# Patient Record
Sex: Female | Born: 1965 | Race: White | Hispanic: No | Marital: Married | Smoking: Never smoker
Health system: Southern US, Community
[De-identification: ages and names within clinical notes are randomized; demographics above are authoritative.]

## PROBLEM LIST (undated history)

## (undated) DIAGNOSIS — R112 Nausea with vomiting, unspecified: Secondary | ICD-10-CM

## (undated) HISTORY — PX: COLON SURGERY: SHX602

## (undated) HISTORY — PX: APPENDECTOMY: SHX54

---

## 1998-03-11 ENCOUNTER — Other Ambulatory Visit: Admission: RE | Admit: 1998-03-11 | Discharge: 1998-03-11 | Payer: Self-pay | Admitting: *Deleted

## 1998-05-09 ENCOUNTER — Other Ambulatory Visit: Admission: RE | Admit: 1998-05-09 | Discharge: 1998-05-09 | Payer: Self-pay | Admitting: *Deleted

## 1998-11-14 ENCOUNTER — Other Ambulatory Visit: Admission: RE | Admit: 1998-11-14 | Discharge: 1998-11-14 | Payer: Self-pay | Admitting: *Deleted

## 2000-05-17 ENCOUNTER — Other Ambulatory Visit: Admission: RE | Admit: 2000-05-17 | Discharge: 2000-05-17 | Payer: Self-pay | Admitting: Obstetrics and Gynecology

## 2000-06-21 ENCOUNTER — Encounter: Payer: Self-pay | Admitting: Obstetrics and Gynecology

## 2000-06-21 ENCOUNTER — Ambulatory Visit (HOSPITAL_COMMUNITY): Admission: RE | Admit: 2000-06-21 | Discharge: 2000-06-21 | Payer: Self-pay | Admitting: Obstetrics and Gynecology

## 2000-08-22 ENCOUNTER — Inpatient Hospital Stay (HOSPITAL_COMMUNITY): Admission: AD | Admit: 2000-08-22 | Discharge: 2000-08-22 | Payer: Self-pay | Admitting: Obstetrics and Gynecology

## 2000-11-15 ENCOUNTER — Inpatient Hospital Stay (HOSPITAL_COMMUNITY): Admission: AD | Admit: 2000-11-15 | Discharge: 2000-11-15 | Payer: Self-pay | Admitting: Obstetrics and Gynecology

## 2000-11-18 ENCOUNTER — Inpatient Hospital Stay (HOSPITAL_COMMUNITY): Admission: AD | Admit: 2000-11-18 | Discharge: 2000-11-18 | Payer: Self-pay | Admitting: Obstetrics and Gynecology

## 2000-11-21 ENCOUNTER — Inpatient Hospital Stay (HOSPITAL_COMMUNITY): Admission: AD | Admit: 2000-11-21 | Discharge: 2000-11-23 | Payer: Self-pay | Admitting: Obstetrics and Gynecology

## 2000-11-21 ENCOUNTER — Encounter (INDEPENDENT_AMBULATORY_CARE_PROVIDER_SITE_OTHER): Payer: Self-pay

## 2002-07-05 ENCOUNTER — Other Ambulatory Visit: Admission: RE | Admit: 2002-07-05 | Discharge: 2002-07-05 | Payer: Self-pay | Admitting: Obstetrics and Gynecology

## 2003-07-08 ENCOUNTER — Other Ambulatory Visit: Admission: RE | Admit: 2003-07-08 | Discharge: 2003-07-08 | Payer: Self-pay | Admitting: Obstetrics and Gynecology

## 2004-07-03 ENCOUNTER — Other Ambulatory Visit: Admission: RE | Admit: 2004-07-03 | Discharge: 2004-07-03 | Payer: Self-pay | Admitting: Obstetrics and Gynecology

## 2009-08-22 ENCOUNTER — Encounter: Admission: RE | Admit: 2009-08-22 | Discharge: 2009-08-22 | Payer: Self-pay | Admitting: Obstetrics and Gynecology

## 2010-05-03 ENCOUNTER — Encounter: Payer: Self-pay | Admitting: Obstetrics and Gynecology

## 2010-08-28 NOTE — Op Note (Signed)
[facility]  Patient:    Debra Walker, Debra Walker                     MRN: [id number] Proc. Date: 11/22/00 Adm. Date:  [id number] Attending:  Oliver Pila                           Operative Report  PREOPERATIVE DIAGNOSIS:       Desires surgical sterility.  POSTOPERATIVE DIAGNOSIS:      Desires surgical sterility.  PROCEDURE:                    Bilateral partial salpingectomy.  SURGEON:                      Zenaida Niece, M.D.  ANESTHESIA:                   Epidural.  ESTIMATED BLOOD LOSS:         Less than 50 cc.  FINDINGS:                     Normal anatomy.  PROCEDURE IN DETAIL:          The patient was taken to the operating room and placed in the dorsal supine position, where her previously-placed epidural was dosed appropriately.  Her abdomen was then prepped and draped in the usual sterile fashion.  The level of her anesthesia was found to be adequate and her infraumbilical skin was infiltrated with 0.25% Marcaine.  A 3 cm horizontal incision was made and carried down to the fascia.  The fascia was elevated and incised and the peritoneum entered bluntly.  Both fallopian tubes were identified and traced to their fimbriated ends.  A knuckle of tube on each side was ligated with 0 plain gut suture.  The knuckle of tube was removed sharply.  Both ostia were identified and both stumps were hemostatic.  The fascia was then closed with running 0 Vicryl and the skin closed with running subcuticular 4-0 Vicryl.  A Band-aid was then placed.  The patient tolerated the procedure well and was taken to the recovery room in stable condition. Counts were correct x 2. DD:  11/22/00 TD:  11/22/00 Job: [id number]

## 2010-08-28 NOTE — Discharge Summary (Signed)
[facility]  Patient:    Debra Walker, Debra Walker Visit Number: [id number] MRN: [id number]          Service Type: OBS Location: 910A [id number] Attending Physician:  Oliver Pila Dictated by:   Alvino Chapel, M.D. Adm. Date:  11/21/2000 Disc. Date: 11/23/2000                             Discharge Summary  DISCHARGE DIAGNOSES:          1. Term pregnancy at 40+ weeks, delivered.                               2. Pregnancy induced hypertension.                               3. Previous cesarean section.                               4. Advanced maternal age.                               5. Desires surgical sterility.                               6. Status post normal spontaneous vaginal                                  delivery/vaginal birth after cesarean.                               7. Status post postpartum tubal ligation.  DISCHARGE MEDICATIONS:        1. Percocet one to two tablets p.o. every                                  four hours p.r.n.                               2. Motrin 600 mg p.o. every six hours p.r.n.  HISTORY OF PRESENT ILLNESS:   The patient is a 45 year old, G5, P2-0-2-2, who was admitted at 40+ weeks for induction given the increased blood pressure but no other evidence of preeclampsia. Blood pressures in the office were 160s/90s-100, on bed rest were 130s/70s. Pregnancy was complicated by advanced maternal age, patient declined amniocentesis; also, a positive group B strep status, and prior low transverse cesarean section and previous VBAC. Patient desired a BTL.  PRENATAL LABORATORY DATA:     O negative, antibody negative, RPR nonreactive, rubella immune, hepatitis B surface antigen negative, HIV negative, GC negative, Chlamydia negative, GBS positive.  PAST OBSTETRICAL HISTORY:     In 1983 she had a spontaneous miscarriage. In 1984, another spontaneous miscarriage. In 1988 she had a low transverse cesarean  section of a 6-pound 13-ounce infant for fetal distress. In 1993 she had a vaginal birth after cesarean of a 7-pound infant.  PAST GYNECOLOGICAL HISTORY:  History of cryosurgery at 45 years old with normal Pap smears since that time.  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  PAST MEDICAL HISTORY:         History of alcohol abuse.  ALLERGIES:                    CODEINE.  MEDICATIONS:                  Without medications.  HOSPITAL COURSE:              On admission she was afebrile with blood pressure 130/70 at rest. Cervix was 50% effaced, 2 cm, and -2 station. The patient had assisted rupture of membranes with clear fluid obtained and was begun on group B strep prophylaxis with penicillin. She progressed well throughout the day and was noted to have some thick meconium later in the evening, therefore, an IUPC was placed and an amnioinfusion begun. She reached complete dilation and pushed well with normal spontaneous vaginal delivery of a vigorous female infant over a second-degree vaginal laceration. Apgars were 8 and 9. Weight was 7 pounds 15 ounces. The placenta delivered spontaneously. The second-degree laceration was repaired with 2-0 Vicryl. Cervix and rectum were intact.  The patient then underwent a postpartum tubal ligation on postpartum day #1 by Dr. Jackelyn Knife without complication.  On postpartum day #2 she was afebrile. Her incision at her umbilicus was clear; therefore, she was felt stable for discharge and was discharged to home with followup in six weeks. Dictated by:   Alvino Chapel, M.D. Attending Physician:  Oliver Pila DD:  12/06/00 TD:  12/06/00 Job: [id number]

## 2011-10-18 ENCOUNTER — Other Ambulatory Visit: Payer: Self-pay | Admitting: Obstetrics and Gynecology

## 2011-10-18 DIAGNOSIS — R928 Other abnormal and inconclusive findings on diagnostic imaging of breast: Secondary | ICD-10-CM

## 2011-10-26 ENCOUNTER — Ambulatory Visit
Admission: RE | Admit: 2011-10-26 | Discharge: 2011-10-26 | Disposition: A | Discharge status: Home / Self Care | Payer: Private Health Insurance - Indemnity | Source: Ambulatory Visit | Attending: Obstetrics and Gynecology | Admitting: Obstetrics and Gynecology

## 2011-10-26 DIAGNOSIS — R928 Other abnormal and inconclusive findings on diagnostic imaging of breast: Secondary | ICD-10-CM

## 2014-05-22 ENCOUNTER — Other Ambulatory Visit: Payer: Self-pay | Admitting: Physician Assistant

## 2014-05-22 DIAGNOSIS — R1011 Right upper quadrant pain: Secondary | ICD-10-CM

## 2014-05-22 DIAGNOSIS — D539 Nutritional anemia, unspecified: Secondary | ICD-10-CM

## 2014-05-22 DIAGNOSIS — R109 Unspecified abdominal pain: Secondary | ICD-10-CM

## 2014-05-30 ENCOUNTER — Ambulatory Visit
Admission: RE | Admit: 2014-05-30 | Discharge: 2014-05-30 | Disposition: A | Discharge status: Home / Self Care | Payer: Managed Care, Other (non HMO) | Source: Ambulatory Visit | Attending: Physician Assistant | Admitting: Physician Assistant

## 2014-05-30 DIAGNOSIS — R109 Unspecified abdominal pain: Secondary | ICD-10-CM

## 2014-05-30 DIAGNOSIS — D539 Nutritional anemia, unspecified: Secondary | ICD-10-CM

## 2014-05-30 DIAGNOSIS — R1011 Right upper quadrant pain: Secondary | ICD-10-CM

## 2015-09-28 ENCOUNTER — Encounter (HOSPITAL_COMMUNITY): Payer: Self-pay

## 2015-09-28 ENCOUNTER — Emergency Department (HOSPITAL_COMMUNITY)
Admission: EM | Admit: 2015-09-28 | Discharge: 2015-09-29 | Disposition: A | Discharge status: Home / Self Care | Payer: Managed Care, Other (non HMO) | Attending: Emergency Medicine | Admitting: Emergency Medicine

## 2015-09-28 DIAGNOSIS — R109 Unspecified abdominal pain: Secondary | ICD-10-CM | POA: Diagnosis present

## 2015-09-28 DIAGNOSIS — F17210 Nicotine dependence, cigarettes, uncomplicated: Secondary | ICD-10-CM | POA: Insufficient documentation

## 2015-09-28 LAB — COMPREHENSIVE METABOLIC PANEL
ALBUMIN: 3.6 g/dL (ref 3.5–5.0)
ALK PHOS: 49 U/L (ref 38–126)
ALT: 23 U/L (ref 14–54)
ANION GAP: 7 (ref 5–15)
AST: 27 U/L (ref 15–41)
BUN: 9 mg/dL (ref 6–20)
CALCIUM: 8.7 mg/dL — AB (ref 8.9–10.3)
CHLORIDE: 108 mmol/L (ref 101–111)
CO2: 24 mmol/L (ref 22–32)
Creatinine, Ser: 0.84 mg/dL (ref 0.44–1.00)
GFR calc Af Amer: >60 mL/min (ref >60)
GFR calc non Af Amer: >60 mL/min (ref >60)
GLUCOSE: 111 mg/dL — AB (ref 65–99)
Potassium: 3.7 mmol/L (ref 3.5–5.1)
SODIUM: 139 mmol/L (ref 135–145)
Total Bilirubin: 0.8 mg/dL (ref 0.3–1.2)
Total Protein: 6.6 g/dL (ref 6.5–8.1)

## 2015-09-28 LAB — URINALYSIS, ROUTINE W REFLEX MICROSCOPIC
BILIRUBIN URINE: NEGATIVE
GLUCOSE, UA: NEGATIVE mg/dL
HGB URINE DIPSTICK: NEGATIVE
Ketones, ur: 15 mg/dL — AB
Leukocytes, UA: NEGATIVE
Nitrite: NEGATIVE
PH: 6.5 (ref 5.0–8.0)
Protein, ur: NEGATIVE mg/dL
SPECIFIC GRAVITY, URINE: 1.027 (ref 1.005–1.030)

## 2015-09-28 LAB — CBC
HEMATOCRIT: 40.1 % (ref 36.0–46.0)
HEMOGLOBIN: 13.1 g/dL (ref 12.0–15.0)
MCH: 29.1 pg (ref 26.0–34.0)
MCHC: 32.7 g/dL (ref 30.0–36.0)
MCV: 89.1 fL (ref 78.0–100.0)
Platelets: 260 10*3/uL (ref 150–400)
RBC: 4.5 MIL/uL (ref 3.87–5.11)
RDW: 13.9 % (ref 11.5–15.5)
WBC: 8 10*3/uL (ref 4.0–10.5)

## 2015-09-28 LAB — LIPASE, BLOOD: Lipase: 27 U/L (ref 11–51)

## 2015-09-28 LAB — I-STAT BETA HCG BLOOD, ED (MC, WL, AP ONLY): I-stat hCG, quantitative: <5 m[IU]/mL (ref <5)

## 2015-09-28 MED ORDER — ONDANSETRON 4 MG PO TBDP
4.0000 mg | ORAL_TABLET | Freq: Once | ORAL | Status: AC | PRN
  Administered 2015-09-28: 4 mg via ORAL

## 2015-09-28 MED ORDER — ONDANSETRON 4 MG PO TBDP
ORAL_TABLET | ORAL | Status: AC
  Filled 2015-09-28: qty 1

## 2015-09-28 NOTE — ED Provider Notes (Signed)
CSN: [id number]     Arrival date & time 09/28/15  2029 History   First MD Initiated Contact with Patient 09/28/15 2305     Chief Complaint  Patient presents with  . Flank Pain     (Consider location/radiation/quality/duration/timing/severity/associated sxs/prior Treatment) HPI Comments: Patient without significant medical history presents with pain in the right flank that started this morning and remained constant throughout the day. She has had persistent nausea without vomiting and loose non-bloody stool x 3. No fever. The pain radiated into the RLQ abdomen at the worst times but no radiation currently. No urinary symptoms of hesitancy, hematuria, dysuria or frequency. No history of kidney stones. She reports same symptoms episodically over the last several years. She has a history of appendectomy with small portion of cecum removed at that time secondary to infection and became concerned pain was related to her previous bowel problem. She states she had a colonoscopy and EGD last year that was clear.  Patient is a 50 y.o. female presenting with flank pain. The history is provided by the patient. No language interpreter was used.  Flank Pain This is a recurrent problem. The current episode started today. The problem occurs constantly. The problem has been gradually improving. Associated symptoms include nausea and vomiting. Pertinent negatives include no abdominal pain, chest pain, chills, coughing or fever.    History reviewed. No pertinent past medical history. Past Surgical History  Procedure Laterality Date  . Appendectomy    . Colon surgery     History reviewed. No pertinent family history. Social History  Substance Use Topics  . Smoking status: Current Every Day Smoker    Types: E-cigarettes  . Smokeless tobacco: None  . Alcohol Use: Yes     Comment: rarely    OB History    No data available     Review of Systems  Constitutional: Negative for fever and chills.    Respiratory: Negative.  Negative for cough and shortness of breath.   Cardiovascular: Negative.  Negative for chest pain.  Gastrointestinal: Positive for nausea, vomiting and diarrhea. Negative for abdominal pain.  Genitourinary: Positive for flank pain. Negative for dysuria, frequency and hematuria.  Musculoskeletal: Negative.   Neurological: Negative.       Allergies  Codeine  Home Medications   Prior to Admission medications   Medication Sig Start Date End Date Taking? Authorizing Provider  cetirizine-pseudoephedrine (ZYRTEC-D) 5-120 MG tablet Take 1 tablet by mouth daily as needed for allergies.   Yes Historical Provider, MD  Prenatal Vit-Fe Fumarate-FA (MULTIVITAMIN-PRENATAL) 27-0.8 MG TABS tablet Take 1 tablet by mouth daily at 12 noon.   Yes Historical Provider, MD  TRIANEX 0.05 % OINT Take 1 application by mouth 2 (two) times daily as needed (for itching).  09/12/15  Yes Historical Provider, MD   BP 120/62 mmHg  Pulse 75  Temp(Src) 98.6 F (37 C) (Oral)  Resp 18  SpO2 97%  LMP 09/14/2015 Physical Exam  Constitutional: She is oriented to person, place, and time. She appears well-developed and well-nourished.  HENT:  Head: Normocephalic.  Neck: Normal range of motion. Neck supple.  Cardiovascular: Normal rate and regular rhythm.   Pulmonary/Chest: Effort normal and breath sounds normal.  Abdominal: Soft. Bowel sounds are normal. There is no tenderness. There is no rebound and no guarding.  Musculoskeletal: Normal range of motion.       Arms: Neurological: She is alert and oriented to person, place, and time.  Skin: Skin is warm and dry. No rash  noted.  Psychiatric: She has a normal mood and affect.    ED Course  Procedures (including critical care time) Labs Review Labs Reviewed  COMPREHENSIVE METABOLIC PANEL - Abnormal; Notable for the following:    Glucose, Bld 111 (*)    Calcium 8.7 (*)    All other components within normal limits  URINALYSIS, ROUTINE W  REFLEX MICROSCOPIC (NOT AT [facility]) - Abnormal; Notable for the following:    Ketones, ur 15 (*)    All other components within normal limits  LIPASE, BLOOD  CBC  I-STAT BETA HCG BLOOD, ED ([facility], [facility], [facility] ONLY)   Results for orders placed or performed during the hospital encounter of 09/28/15  Lipase, blood  Result Value Ref Range   Lipase 27 11 - 51 U/L  Comprehensive metabolic panel  Result Value Ref Range   Sodium 139 135 - 145 mmol/L   Potassium 3.7 3.5 - 5.1 mmol/L   Chloride 108 101 - 111 mmol/L   CO2 24 22 - 32 mmol/L   Glucose, Bld 111 (H) 65 - 99 mg/dL   BUN 9 6 - 20 mg/dL   Creatinine, Ser 1.61 0.44 - 1.00 mg/dL   Calcium 8.7 (L) 8.9 - 10.3 mg/dL   Total Protein 6.6 6.5 - 8.1 g/dL   Albumin 3.6 3.5 - 5.0 g/dL   AST 27 15 - 41 U/L   ALT 23 14 - 54 U/L   Alkaline Phosphatase 49 38 - 126 U/L   Total Bilirubin 0.8 0.3 - 1.2 mg/dL   GFR calc non Af Amer >60 >60 mL/min   GFR calc Af Amer >60 >60 mL/min   Anion gap 7 5 - 15  CBC  Result Value Ref Range   WBC 8.0 4.0 - 10.5 K/uL   RBC 4.50 3.87 - 5.11 MIL/uL   Hemoglobin 13.1 12.0 - 15.0 g/dL   HCT 09.6 04.5 - 40.9 %   MCV 89.1 78.0 - 100.0 fL   MCH 29.1 26.0 - 34.0 pg   MCHC 32.7 30.0 - 36.0 g/dL   RDW 81.1 91.4 - 78.2 %   Platelets 260 150 - 400 K/uL  Urinalysis, Routine w reflex microscopic  Result Value Ref Range   Color, Urine YELLOW YELLOW   APPearance CLEAR CLEAR   Specific Gravity, Urine 1.027 1.005 - 1.030   pH 6.5 5.0 - 8.0   Glucose, UA NEGATIVE NEGATIVE mg/dL   Hgb urine dipstick NEGATIVE NEGATIVE   Bilirubin Urine NEGATIVE NEGATIVE   Ketones, ur 15 (A) NEGATIVE mg/dL   Protein, ur NEGATIVE NEGATIVE mg/dL   Nitrite NEGATIVE NEGATIVE   Leukocytes, UA NEGATIVE NEGATIVE  I-Stat beta hCG blood, ED  Result Value Ref Range   I-stat hCG, quantitative <5.0 <5 mIU/mL   Comment 3           Ct Abdomen Pelvis Wo Contrast  09/29/2015  CLINICAL DATA:  50 year old female with right flank pain. EXAM: CT ABDOMEN AND  PELVIS WITHOUT CONTRAST TECHNIQUE: Multidetector CT imaging of the abdomen and pelvis was performed following the standard protocol without IV contrast. COMPARISON:  None. FINDINGS: Evaluation of this exam is limited in the absence of intravenous contrast. The visualized lung bases are clear. No intra-abdominal free air or free fluid. The liver, gallbladder, pancreas, spleen, adrenal glands appear unremarkable. Vascular calcification versus punctate nonobstructing left renal upper pole calculus. No hydronephrosis. The right kidney is unremarkable. The visualized ureters and urinary bladder appear unremarkable. The uterus and the ovaries are grossly unremarkable. There  is postsurgical changes of the bowel with ileocolic anastomosis in the right lower quadrant. There is no evidence of bowel obstruction or active inflammation. Appendectomy. There is mild to moderate aortoiliac atherosclerotic disease. Evaluation of the vasculature is limited in the absence of intravenous contrast. No portal venous gas identified. There is no adenopathy. The abdominal wall soft tissues appear unremarkable. The osseous structures are intact. IMPRESSION: No acute intra-abdominal pelvic pathology. No hydronephrosis or obstructing stone. Vascular calcification versus a nonobstructing punctate left renal upper pole calculus. Electronically Signed   By: Elgie Collard M.D.   On: 09/29/2015 01:39     Imaging Review No results found. I have personally reviewed and evaluated these images and lab results as part of my medical decision-making.   EKG Interpretation None      MDM   Final diagnoses:  None    1. Right flank pain  The patient presents with recurrent right flank pain that started earlier today. She states pain has been recurrent over a couple of years. No fever.   Labs and CT (w/o CM) are reassuring without concern for infection or acute process. She is resting comfortably, reporting pain increases when she is  active and better when she rests. No nausea currently. She is felt stable for discharge home with PCP follow up for recheck and further outpatient testing as appropriate.     Elpidio Anis, PA-C 09/29/15 [id number]  Geoffery Lyons, MD 09/29/15 [id number]

## 2015-09-28 NOTE — ED Notes (Addendum)
Onset this morning right flank pain, non radiating.  Pain causing nausea.  Diarrhea x 2, watery.  No fever.   Pt has had same pain numerous times in past, no diagnosis found, last episode 1-2 months ago.

## 2015-09-29 ENCOUNTER — Emergency Department (HOSPITAL_COMMUNITY): Payer: Managed Care, Other (non HMO)

## 2015-09-29 ENCOUNTER — Encounter (HOSPITAL_COMMUNITY): Payer: Self-pay | Admitting: Radiology

## 2015-09-29 MED ORDER — DIPHENHYDRAMINE HCL 50 MG/ML IJ SOLN: 25.0000 mg | Freq: Once | INTRAMUSCULAR | Status: DC

## 2015-09-29 MED ORDER — HYDROMORPHONE HCL 1 MG/ML IJ SOLN: 1.0000 mg | Freq: Once | INTRAMUSCULAR | Status: DC

## 2015-09-29 MED ORDER — HYDROCODONE-ACETAMINOPHEN 5-325 MG PO TABS
1.0000 | ORAL_TABLET | Freq: Once | ORAL | Status: AC
  Administered 2015-09-29: 1 via ORAL
  Filled 2015-09-29: qty 1

## 2015-09-29 MED ORDER — HYDROCODONE-ACETAMINOPHEN 5-325 MG PO TABS: 1.0000 | ORAL_TABLET | ORAL | Status: DC | PRN

## 2015-09-29 MED ORDER — ONDANSETRON HCL 4 MG PO TABS: 4.0000 mg | ORAL_TABLET | Freq: Four times a day (QID) | ORAL | Status: DC

## 2015-09-29 NOTE — ED Notes (Signed)
Taken to CT at this time. 

## 2015-09-29 NOTE — Discharge Instructions (Signed)
Flank Pain °Flank pain refers to pain that is located on the side of the body between the upper abdomen and the back. The pain may occur over a short period of time (acute) or may be long-term or reoccurring (chronic). It may be mild or severe. Flank pain can be caused by many things. °CAUSES  °Some of the more common causes of flank pain include: °· Muscle strains.   °· Muscle spasms.   °· A disease of your spine (vertebral disk disease).   °· A lung infection (pneumonia).   °· Fluid around your lungs (pulmonary edema).   °· A kidney infection.   °· Kidney stones.   °· A very painful skin rash caused by the chickenpox virus (shingles).   °· Gallbladder disease.   °HOME CARE INSTRUCTIONS  °Home care will depend on the cause of your pain. In general, °· Rest as directed by your caregiver. °· Drink enough fluids to keep your urine clear or pale yellow. °· Only take over-the-counter or prescription medicines as directed by your caregiver. Some medicines may help relieve the pain. °· Tell your caregiver about any changes in your pain. °· Follow up with your caregiver as directed. °SEEK IMMEDIATE MEDICAL CARE IF:  °· Your pain is not controlled with medicine.   °· You have new or worsening symptoms. °· Your pain increases.   °· You have abdominal pain.   °· You have shortness of breath.   °· You have persistent nausea or vomiting.   °· You have swelling in your abdomen.   °· You feel faint or pass out.   °· You have blood in your urine. °· You have a fever or persistent symptoms for more than 2-3 days. °· You have a fever and your symptoms suddenly get worse. °MAKE SURE YOU:  °· Understand these instructions. °· Will watch your condition. °· Will get help right away if you are not doing well or get worse. °  °This information is not intended to replace advice given to you by your health care provider. Make sure you discuss any questions you have with your health care provider. °  °Document Released: 05/20/2005 Document  Revised: 12/22/2011 Document Reviewed: 11/11/2011 °[employer]vier Interactive Patient Education ©2016 [employer] ° °

## 2016-09-10 DEATH — deceased

## 2019-08-19 ENCOUNTER — Other Ambulatory Visit: Payer: Self-pay

## 2019-08-19 ENCOUNTER — Emergency Department (HOSPITAL_BASED_OUTPATIENT_CLINIC_OR_DEPARTMENT_OTHER): Payer: 59

## 2019-08-19 ENCOUNTER — Emergency Department (HOSPITAL_BASED_OUTPATIENT_CLINIC_OR_DEPARTMENT_OTHER)
Admission: EM | Admit: 2019-08-19 | Discharge: 2019-08-20 | Disposition: A | Discharge status: Home / Self Care | Payer: 59 | Attending: Emergency Medicine | Admitting: Emergency Medicine

## 2019-08-19 ENCOUNTER — Encounter (HOSPITAL_BASED_OUTPATIENT_CLINIC_OR_DEPARTMENT_OTHER): Payer: Self-pay | Admitting: Emergency Medicine

## 2019-08-19 DIAGNOSIS — Z79899 Other long term (current) drug therapy: Secondary | ICD-10-CM | POA: Diagnosis not present

## 2019-08-19 DIAGNOSIS — Y999 Unspecified external cause status: Secondary | ICD-10-CM | POA: Insufficient documentation

## 2019-08-19 DIAGNOSIS — W1789XA Other fall from one level to another, initial encounter: Secondary | ICD-10-CM | POA: Diagnosis not present

## 2019-08-19 DIAGNOSIS — Y929 Unspecified place or not applicable: Secondary | ICD-10-CM | POA: Insufficient documentation

## 2019-08-19 DIAGNOSIS — F17290 Nicotine dependence, other tobacco product, uncomplicated: Secondary | ICD-10-CM | POA: Diagnosis not present

## 2019-08-19 DIAGNOSIS — Y9389 Activity, other specified: Secondary | ICD-10-CM | POA: Diagnosis not present

## 2019-08-19 DIAGNOSIS — Z885 Allergy status to narcotic agent status: Secondary | ICD-10-CM | POA: Insufficient documentation

## 2019-08-19 DIAGNOSIS — S99912A Unspecified injury of left ankle, initial encounter: Secondary | ICD-10-CM | POA: Diagnosis present

## 2019-08-19 DIAGNOSIS — S8262XA Displaced fracture of lateral malleolus of left fibula, initial encounter for closed fracture: Secondary | ICD-10-CM | POA: Insufficient documentation

## 2019-08-19 LAB — PREGNANCY, URINE: Preg Test, Ur: NEGATIVE

## 2019-08-19 MED ORDER — ONDANSETRON HCL 4 MG/2ML IJ SOLN
4.0000 mg | Freq: Once | INTRAMUSCULAR | Status: AC
  Administered 2019-08-19: 4 mg via INTRAVENOUS
  Filled 2019-08-19: qty 2

## 2019-08-19 MED ORDER — HYDROCODONE-ACETAMINOPHEN 5-325 MG PO TABS
1.0000 | ORAL_TABLET | Freq: Once | ORAL | Status: AC
  Administered 2019-08-19: 1 via ORAL
  Filled 2019-08-19: qty 1

## 2019-08-19 MED ORDER — HYDROCODONE-ACETAMINOPHEN 5-325 MG PO TABS: 1.0000 | ORAL_TABLET | ORAL | 0 refills | Status: DC | PRN

## 2019-08-19 MED ORDER — HYDROMORPHONE HCL 1 MG/ML IJ SOLN
1.0000 mg | Freq: Once | INTRAMUSCULAR | Status: AC
  Administered 2019-08-19: 1 mg via INTRAVENOUS
  Filled 2019-08-19: qty 1

## 2019-08-19 NOTE — ED Provider Notes (Signed)
[facility] EMERGENCY DEPARTMENT Provider Note   CSN: [id number] Arrival date & time: 08/19/19  1939    History Chief Complaint  Patient presents with  . Ankle Injury    Debra Walker is a 54 y.o. female with no significant past medical history who presents for evaluation of left ankle pain.  Was on an inflatable mechanical bowl and fell off approximately 2 hours ago.  Admits to eversion of ankle.  Note swelling to her left lateral malleolus with swelling.  Has been unable to walk since then.  Hitting head, LOC or anticoagulation.  Is not take anything for pain.  Rates pain a 5/10.  Denies fever, chills, nausea, vomiting, headache, lightheadedness, dizziness, paresthesias, redness, warmth to extremities.  Denies additional aggravating or relieving factors.  History obtained from patient and past medical records.  No interpreter is used  HPI     No past medical history on file.  There are no problems to display for this patient.   Past Surgical History:  Procedure Laterality Date  . APPENDECTOMY    . CESAREAN SECTION    . COLON SURGERY       OB History   No obstetric history on file.     No family history on file.  Social History   Tobacco Use  . Smoking status: Current Every Day Smoker    Types: E-cigarettes  Substance Use Topics  . Alcohol use: Not Currently    Comment: rarely   . Drug use: No    Home Medications Prior to Admission medications   Medication Sig Start Date End Date Taking? Authorizing Provider  cetirizine-pseudoephedrine (ZYRTEC-D) 5-120 MG tablet Take 1 tablet by mouth daily as needed for allergies.    [provider]  HYDROcodone-acetaminophen (NORCO/VICODIN) 5-325 MG tablet Take 1 tablet by mouth every 4 (four) hours as needed. 08/19/19   Trea Carnegie A, PA-C  ondansetron (ZOFRAN) 4 MG tablet Take 1 tablet (4 mg total) by mouth every 6 (six) hours. 09/29/15   Elpidio Anis, PA-C  Prenatal Vit-Fe Fumarate-FA  (MULTIVITAMIN-PRENATAL) 27-0.8 MG TABS tablet Take 1 tablet by mouth daily at 12 noon.    [provider]  TRIANEX 0.05 % OINT Take 1 application by mouth 2 (two) times daily as needed (for itching).  09/12/15   [provider]    Allergies    Codeine  Review of Systems   Review of Systems  Constitutional: Negative.   HENT: Negative.   Respiratory: Negative.   Cardiovascular: Negative.   Gastrointestinal: Negative.   Genitourinary: Negative.   Musculoskeletal:       Left ankle pain  Skin: Negative.   Neurological: Negative.   All other systems reviewed and are negative.   Physical Exam Updated Vital Signs BP (!) 147/73 (BP Location: Right Arm)   Pulse 66   Temp 98.5 F (36.9 C) (Oral)   Resp 16   Ht 5\' 5"  (1.651 m)   Wt 80.3 kg   LMP 11/22/2018   SpO2 98%   BMI 29.45 kg/m   Physical Exam Vitals and nursing note reviewed.  Constitutional:      General: She is not in acute distress.    Appearance: She is well-developed. She is not ill-appearing, toxic-appearing or diaphoretic.  HENT:     Head: Normocephalic and atraumatic.     Nose: Nose normal.     Mouth/Throat:     Mouth: Mucous membranes are moist.  Eyes:     Pupils: Pupils are equal,  round, and reactive to light.  Cardiovascular:     Rate and Rhythm: Normal rate.     Pulses: Normal pulses.          Dorsalis pedis pulses are 2+ on the right side and 2+ on the left side.       Posterior tibial pulses are 2+ on the right side and 2+ on the left side.     Heart sounds: Normal heart sounds.  Pulmonary:     Effort: Pulmonary effort is normal. No respiratory distress.     Breath sounds: Normal breath sounds.  Abdominal:     General: Bowel sounds are normal. There is no distension.  Musculoskeletal:     Cervical back: Normal range of motion.     Right knee: Normal.     Left knee: Normal.     Right lower leg: Normal.     Left lower leg: Normal.     Right ankle: Normal.     Right Achilles  Tendon: Normal.     Left ankle: Swelling present. Tenderness present. Decreased range of motion.     Left Achilles Tendon: Normal.     Right foot: Normal.     Left foot: Normal.       Feet:     Comments: Palpation to left lateral malleolus.  No bony tenderness to midshaft tibia or fibula.  Wiggles toes without difficulty.  No bony tenderness to foot.  Feet:     Right foot:     Skin integrity: Skin integrity normal.     Left foot:     Skin integrity: Skin integrity normal.  Skin:    General: Skin is warm and dry.     Capillary Refill: Capillary refill takes less than 2 seconds.     Comments: Brisk capillary refill.  Soft tissue swelling to left lateral malleolus.  No erythema, warmth.  No contusions or abrasions.  Neurological:     General: No focal deficit present.     Mental Status: She is alert.     Comments: Unable to ambulate secondary to pain.  Intact sensation to bilateral lower extremities without difficulty.     ED Results / Procedures / Treatments   Labs (all labs ordered are listed, but only abnormal results are displayed) Labs Reviewed  PREGNANCY, URINE    EKG None  Radiology DG Ankle Complete Left  Result Date: 08/19/2019 CLINICAL DATA:  Left ankle fracture status post reduction EXAM: LEFT ANKLE COMPLETE - 3+ VIEW COMPARISON:  08/19/2019 FINDINGS: Frontal, oblique, lateral views of the left ankle are obtained. Casting material obscures underlying bony detail. There is persistent lateral translation of the talus relative to the tibial plafond and. Displaced lateral malleolar fracture demonstrates improved alignment with decreased angulation and translation. Small ossific density medial to the left talus is unchanged in position, possibly related to acute avulsion fracture. IMPRESSION: 1. Persistent lateral translation of the talus relative to the tibial plafond and. 2. Decreased angulation and displacement of the lateral malleolar fracture fragment. 3. Ossific density  medial to the talus, possibly representing a displaced avulsion fracture. Electronically Signed   By: Sharlet Salina M.D.   On: 08/19/2019 23:15   DG Ankle Complete Left  Result Date: 08/19/2019 CLINICAL DATA:  Status post trauma. EXAM: LEFT ANKLE COMPLETE - 3+ VIEW COMPARISON:  None. FINDINGS: Acute fracture deformity is seen extending through the left lateral malleolus. A 6 mm cortical density is seen adjacent to the medial aspect of the left talus. There is  mild lateral dislocation of the left ankle with subsequent disruption of the left ankle mortise. There is no evidence of arthropathy or other focal bone abnormality. Mild diffuse soft tissue swelling is seen. IMPRESSION: 1. Acute fracture of the left lateral malleolus with mild lateral dislocation of the left ankle. 2. Small cortical density adjacent to the medial aspect of the left talus which may represent a small displaced fracture fragment. Electronically Signed   By: Virgina Norfolk M.D.   On: 08/19/2019 20:51    Procedures .Ortho Injury Treatment  Date/Time: 08/19/2019 11:48 PM Performed by: Shelby Dubin A, PA-C Authorized by: Nettie Elm, PA-C   Consent:    Consent obtained:  Verbal   Consent given by:  Patient   Risks discussed:  Fracture, nerve damage, restricted joint movement, vascular damage, stiffness, recurrent dislocation and irreducible dislocation   Alternatives discussed:  No treatment, alternative treatment, immobilization, referral and delayed treatmentInjury location: ankle Location details: left ankle Injury type: fracture-dislocation Fracture type: lateral malleolus Pre-procedure neurovascular assessment: neurovascularly intact Pre-procedure distal perfusion: normal Pre-procedure neurological function: normal Pre-procedure range of motion: normal  Anesthesia: Local anesthesia used: no  Patient sedated: NoManipulation performed: yes Skin traction used: no Skeletal traction used: no X-ray  confirmed reduction: yes Immobilization: splint Splint type: ankle stirrup and short leg Supplies used: aluminum splint,  cotton padding,  elastic bandage,  Ortho-Glass and plaster Post-procedure neurovascular assessment: post-procedure neurovascularly intact Post-procedure distal perfusion: normal Post-procedure neurological function: normal Post-procedure range of motion: normal Patient tolerance: patient tolerated the procedure well with no immediate complications    (including critical care time)  Medications Ordered in ED Medications  HYDROmorphone (DILAUDID) injection 1 mg (1 mg Intravenous Given 08/19/19 2130)  ondansetron (ZOFRAN) injection 4 mg (4 mg Intravenous Given 08/19/19 2130)  HYDROcodone-acetaminophen (NORCO/VICODIN) 5-325 MG per tablet 1 tablet (1 tablet Oral Given 08/19/19 2325)    ED Course  I have reviewed the triage vital signs and the nursing notes.  Pertinent labs & imaging results that were available during my care of the patient were reviewed by me and considered in my medical decision making (see chart for details).  54 year old female appears otherwise well presents for evaluation of ankle injury. Sustained approximately 3 hours PTA.  Denies hitting head, LOC or anticoagulation.  Neurovascularly intact.  Soft tissue swelling to lateral malleolus.  Able to plantarflex and dorsiflex however with pain.  No tenderness to foot, midshaft or proximal tibia or fibula.  Plan on imaging and reassess  Imaging with lateral malleolus fracture and dislocation.  Plan on reduction.  Patient ankle fracture/dislocation reduced by attending, Dr. Johnney Killian.  See note.  Splint placed.  Post reduction films show she has had decrease in angulation and displacement however some mild translation.  Suspect due to unstable ankle due to lateral malleolar fracture.  Discussed with attending, Dr. Vallery Ridge.  States patient will follow outpatient with orthopedics.  Do not need to consult here in ED.   Patient continues to be neurovascularly intact after splint placement.   The patient has been appropriately medically screened and/or stabilized in the ED. I have low suspicion for any other emergent medical condition which would require further screening, evaluation or treatment in the ED or require inpatient management.  Patient is hemodynamically stable and in no acute distress.  Patient able to ambulate in department prior to ED.  Evaluation does not show acute pathology that would require ongoing or additional emergent interventions while in the emergency department or further inpatient treatment.  I have discussed the diagnosis with the patient and answered all questions.  Pain is been managed while in the emergency department and patient has no further complaints prior to discharge.  Patient is comfortable with plan discussed in room and is stable for discharge at this time.  I have discussed strict return precautions for returning to the emergency department.  Patient was encouraged to follow-up with PCP/specialist refer to at discharge.    MDM Rules/Calculators/A&P                       Final Clinical Impression(s) / ED Diagnoses Final diagnoses:  Closed fracture of distal lateral malleolus of left fibula, initial encounter    Rx / DC Orders ED Discharge Orders         Ordered    HYDROcodone-acetaminophen (NORCO/VICODIN) 5-325 MG tablet  Every 4 hours PRN     08/19/19 2339           Maisyn Nouri A, PA-C 08/19/19 2351    Arby Barrette, MD 08/24/19 1702

## 2019-08-19 NOTE — ED Triage Notes (Signed)
Pt reports ankle injury after fall from mechanical bull approx 2 hrs pta. Swelling noted. Pt reports pain when bearing weight

## 2019-08-19 NOTE — Discharge Instructions (Signed)
Follow-up with orthopedics tomorrow.  Take your medication as prescribed.

## 2019-08-20 ENCOUNTER — Encounter (HOSPITAL_BASED_OUTPATIENT_CLINIC_OR_DEPARTMENT_OTHER): Payer: Self-pay | Admitting: Orthopedic Surgery

## 2019-08-20 ENCOUNTER — Other Ambulatory Visit: Payer: Self-pay

## 2019-08-21 ENCOUNTER — Other Ambulatory Visit (HOSPITAL_COMMUNITY)
Admission: RE | Admit: 2019-08-21 | Discharge: 2019-08-21 | Disposition: A | Discharge status: Home / Self Care | Payer: 59 | Source: Ambulatory Visit | Attending: Orthopedic Surgery | Admitting: Orthopedic Surgery

## 2019-08-21 DIAGNOSIS — Z20822 Contact with and (suspected) exposure to covid-19: Secondary | ICD-10-CM | POA: Insufficient documentation

## 2019-08-21 DIAGNOSIS — Z01812 Encounter for preprocedural laboratory examination: Secondary | ICD-10-CM | POA: Diagnosis not present

## 2019-08-21 LAB — SARS CORONAVIRUS 2 (TAT 6-24 HRS): SARS Coronavirus 2: NEGATIVE

## 2019-08-22 ENCOUNTER — Other Ambulatory Visit (HOSPITAL_COMMUNITY): Payer: 59

## 2019-08-23 NOTE — H&P (Signed)
[facility] [address]   [place] [address] [phone] A Division of [facility]  RE: Debra Walker, Debra Walker                                  [id number]         DOB: 02/28/1966 INITIAL EVALUATION 08/20/2019  Reason for visit:  Left ankle injury suffered 08/19/2019.   HPI: She fell off a mechanical bull.  She was seen in the emergency room.  X-rays demonstrated a trimalleolar ankle fracture.    OBJECTIVE: The patient is a well appearing female, in no apparent distress.  Splint is benign.  Neurovascularly intact to her toes.  Pain with passive motion.   IMAGES: X-rays show trimalleolar ankle fracture with mild displacement.    ASSESSMENT/PLAN:  Trimalleolar ankle fracture.  I recommend open reduction and internal fixation of this with possible syndesmosis repair.  She is going to elevate and we will set that up for this week.     Jewel Baize.  Eulah Pont, M.D.  Electronically verified by Jewel Baize. Eulah Pont, M.D. TDM:pmw Cc:  Antony Haste MD  fax [phone]  D 08/22/19 T 08/23/19

## 2019-08-23 NOTE — Anesthesia Preprocedure Evaluation (Addendum)
Anesthesia Evaluation  Patient identified by MRN, date of birth, ID band Patient awake    Reviewed: Allergy & Precautions, NPO status , Patient's Chart, lab work & pertinent test results  History of Anesthesia Complications (+) PONV and history of anesthetic complications  Airway Mallampati: II  TM Distance: >3 FB Neck ROM: Full    Dental no notable dental hx. (+) Teeth Intact, Dental Advisory Given   Pulmonary Current Smoker and Patient abstained from smoking.,    Pulmonary exam normal breath sounds clear to auscultation       Cardiovascular Exercise Tolerance: Good negative cardio ROS Normal cardiovascular exam Rhythm:Regular Rate:Normal     Neuro/Psych negative neurological ROS  negative psych ROS   GI/Hepatic negative GI ROS, Neg liver ROS,   Endo/Other  negative endocrine ROS  Renal/GU negative Renal ROS     Musculoskeletal negative musculoskeletal ROS (+)   Abdominal   Peds  Hematology negative hematology ROS (+)   Anesthesia Other Findings   Reproductive/Obstetrics                            Anesthesia Physical Anesthesia Plan  ASA: I  Anesthesia Plan: General and Regional   Post-op Pain Management:  Regional for Post-op pain   Induction:   PONV Risk Score and Plan: 4 or greater and Treatment may vary due to age or medical condition, Ondansetron, Dexamethasone and Midazolam  Airway Management Planned: LMA  Additional Equipment: None  Intra-op Plan:   Post-operative Plan: Extubation in OR  Informed Consent: I have reviewed the patients History and Physical, chart, labs and discussed the procedure including the risks, benefits and alternatives for the proposed anesthesia with the patient or authorized representative who has indicated his/her understanding and acceptance.     Dental advisory given  Plan Discussed with: CRNA  Anesthesia Plan Comments: (GA w L  Popliteal + adductor canal n block)       Anesthesia Quick Evaluation

## 2019-08-24 ENCOUNTER — Encounter (HOSPITAL_BASED_OUTPATIENT_CLINIC_OR_DEPARTMENT_OTHER)
Admission: RE | Disposition: A | Discharge status: Home / Self Care | Payer: Self-pay | Source: Home / Self Care | Attending: Orthopedic Surgery

## 2019-08-24 ENCOUNTER — Ambulatory Visit (HOSPITAL_BASED_OUTPATIENT_CLINIC_OR_DEPARTMENT_OTHER)
Admission: RE | Admit: 2019-08-24 | Discharge: 2019-08-24 | Disposition: A | Discharge status: Home / Self Care | Payer: 59 | Attending: Orthopedic Surgery | Admitting: Orthopedic Surgery

## 2019-08-24 ENCOUNTER — Ambulatory Visit (HOSPITAL_BASED_OUTPATIENT_CLINIC_OR_DEPARTMENT_OTHER): Payer: 59 | Admitting: Anesthesiology

## 2019-08-24 ENCOUNTER — Encounter (HOSPITAL_BASED_OUTPATIENT_CLINIC_OR_DEPARTMENT_OTHER): Payer: Self-pay | Admitting: Orthopedic Surgery

## 2019-08-24 ENCOUNTER — Other Ambulatory Visit: Payer: Self-pay

## 2019-08-24 DIAGNOSIS — M25372 Other instability, left ankle: Secondary | ICD-10-CM | POA: Insufficient documentation

## 2019-08-24 DIAGNOSIS — S82892A Other fracture of left lower leg, initial encounter for closed fracture: Secondary | ICD-10-CM

## 2019-08-24 DIAGNOSIS — W1789XA Other fall from one level to another, initial encounter: Secondary | ICD-10-CM | POA: Insufficient documentation

## 2019-08-24 DIAGNOSIS — S82852A Displaced trimalleolar fracture of left lower leg, initial encounter for closed fracture: Secondary | ICD-10-CM | POA: Diagnosis present

## 2019-08-24 DIAGNOSIS — F17200 Nicotine dependence, unspecified, uncomplicated: Secondary | ICD-10-CM | POA: Insufficient documentation

## 2019-08-24 HISTORY — PX: ORIF ANKLE FRACTURE: SHX5408

## 2019-08-24 HISTORY — DX: Nausea with vomiting, unspecified: R11.2

## 2019-08-24 LAB — POCT PREGNANCY, URINE: Preg Test, Ur: NEGATIVE

## 2019-08-24 SURGERY — OPEN REDUCTION INTERNAL FIXATION (ORIF) ANKLE FRACTURE
Anesthesia: Regional | Site: Ankle | Laterality: Left

## 2019-08-24 MED ORDER — EPHEDRINE 5 MG/ML INJ
INTRAVENOUS | Status: AC
  Filled 2019-08-24: qty 10

## 2019-08-24 MED ORDER — 0.9 % SODIUM CHLORIDE (POUR BTL) OPTIME
TOPICAL | Status: DC | PRN
  Administered 2019-08-24: 120 mL

## 2019-08-24 MED ORDER — FENTANYL CITRATE (PF) 100 MCG/2ML IJ SOLN
INTRAMUSCULAR | Status: AC
  Filled 2019-08-24: qty 2

## 2019-08-24 MED ORDER — ONDANSETRON HCL 4 MG/2ML IJ SOLN
INTRAMUSCULAR | Status: DC | PRN
  Administered 2019-08-24: 4 mg via INTRAVENOUS

## 2019-08-24 MED ORDER — MEPERIDINE HCL 25 MG/ML IJ SOLN: 6.2500 mg | INTRAMUSCULAR | Status: DC | PRN

## 2019-08-24 MED ORDER — ONDANSETRON HCL 4 MG/2ML IJ SOLN
INTRAMUSCULAR | Status: AC
  Filled 2019-08-24: qty 2

## 2019-08-24 MED ORDER — LACTATED RINGERS IV SOLN: INTRAVENOUS | Status: DC

## 2019-08-24 MED ORDER — PHENYLEPHRINE 40 MCG/ML (10ML) SYRINGE FOR IV PUSH (FOR BLOOD PRESSURE SUPPORT)
PREFILLED_SYRINGE | INTRAVENOUS | Status: AC
  Filled 2019-08-24: qty 10

## 2019-08-24 MED ORDER — CLONIDINE HCL (ANALGESIA) 100 MCG/ML EP SOLN
EPIDURAL | Status: DC | PRN
Start: 2019-08-24 — End: 2019-08-24
  Administered 2019-08-24: 100 ug

## 2019-08-24 MED ORDER — FENTANYL CITRATE (PF) 100 MCG/2ML IJ SOLN
50.0000 ug | INTRAMUSCULAR | Status: DC | PRN
  Administered 2019-08-24: 50 ug via INTRAVENOUS

## 2019-08-24 MED ORDER — FENTANYL CITRATE (PF) 100 MCG/2ML IJ SOLN
INTRAMUSCULAR | Status: DC | PRN
  Administered 2019-08-24 (×2): 25 ug via INTRAVENOUS
  Administered 2019-08-24: 50 ug via INTRAVENOUS

## 2019-08-24 MED ORDER — HYDROCODONE-ACETAMINOPHEN 7.5-325 MG PO TABS
1.0000 | ORAL_TABLET | Freq: Once | ORAL | Status: DC | PRN
  Filled 2019-08-24: qty 1

## 2019-08-24 MED ORDER — METHOCARBAMOL 500 MG PO TABS: 500.0000 mg | ORAL_TABLET | Freq: Three times a day (TID) | ORAL | 0 refills | Status: AC | PRN

## 2019-08-24 MED ORDER — ROPIVACAINE HCL 5 MG/ML IJ SOLN
INTRAMUSCULAR | Status: DC | PRN
Start: 2019-08-24 — End: 2019-08-24
  Administered 2019-08-24: 30 mL via PERINEURAL
  Administered 2019-08-24: 10 mL via PERINEURAL

## 2019-08-24 MED ORDER — CELECOXIB 200 MG PO CAPS: 200.0000 mg | ORAL_CAPSULE | Freq: Two times a day (BID) | ORAL | 0 refills | Status: AC

## 2019-08-24 MED ORDER — OXYCODONE HCL 5 MG PO TABS: 5.0000 mg | ORAL_TABLET | ORAL | 0 refills | Status: AC | PRN

## 2019-08-24 MED ORDER — ACETAMINOPHEN 500 MG PO TABS: 1000.0000 mg | ORAL_TABLET | Freq: Three times a day (TID) | ORAL | 0 refills | Status: AC

## 2019-08-24 MED ORDER — DEXAMETHASONE SODIUM PHOSPHATE 10 MG/ML IJ SOLN
INTRAMUSCULAR | Status: AC
  Filled 2019-08-24: qty 1

## 2019-08-24 MED ORDER — LIDOCAINE 2% (20 MG/ML) 5 ML SYRINGE
INTRAMUSCULAR | Status: AC
  Filled 2019-08-24: qty 5

## 2019-08-24 MED ORDER — MIDAZOLAM HCL 2 MG/2ML IJ SOLN
INTRAMUSCULAR | Status: AC
  Filled 2019-08-24: qty 2

## 2019-08-24 MED ORDER — CEFAZOLIN SODIUM-DEXTROSE 2-4 GM/100ML-% IV SOLN
INTRAVENOUS | Status: AC
  Filled 2019-08-24: qty 100

## 2019-08-24 MED ORDER — EPHEDRINE SULFATE-NACL 50-0.9 MG/10ML-% IV SOSY
PREFILLED_SYRINGE | INTRAVENOUS | Status: DC | PRN
  Administered 2019-08-24: 5 mg via INTRAVENOUS
  Administered 2019-08-24: 10 mg via INTRAVENOUS
  Administered 2019-08-24 (×2): 5 mg via INTRAVENOUS

## 2019-08-24 MED ORDER — PHENYLEPHRINE 40 MCG/ML (10ML) SYRINGE FOR IV PUSH (FOR BLOOD PRESSURE SUPPORT)
PREFILLED_SYRINGE | INTRAVENOUS | Status: DC | PRN
  Administered 2019-08-24 (×3): 80 ug via INTRAVENOUS

## 2019-08-24 MED ORDER — ACETAMINOPHEN 500 MG PO TABS
ORAL_TABLET | ORAL | Status: AC
  Filled 2019-08-24: qty 2

## 2019-08-24 MED ORDER — LIDOCAINE HCL (CARDIAC) PF 100 MG/5ML IV SOSY
PREFILLED_SYRINGE | INTRAVENOUS | Status: DC | PRN
  Administered 2019-08-24: 100 mg via INTRAVENOUS

## 2019-08-24 MED ORDER — CEFAZOLIN SODIUM-DEXTROSE 2-4 GM/100ML-% IV SOLN
2.0000 g | INTRAVENOUS | Status: AC
  Administered 2019-08-24: 2 g via INTRAVENOUS

## 2019-08-24 MED ORDER — PROPOFOL 10 MG/ML IV BOLUS
INTRAVENOUS | Status: DC | PRN
  Administered 2019-08-24: 120 mg via INTRAVENOUS

## 2019-08-24 MED ORDER — CHLORHEXIDINE GLUCONATE 4 % EX LIQD: 60.0000 mL | Freq: Once | CUTANEOUS | Status: DC

## 2019-08-24 MED ORDER — ACETAMINOPHEN 10 MG/ML IV SOLN: 1000.0000 mg | Freq: Once | INTRAVENOUS | Status: DC | PRN

## 2019-08-24 MED ORDER — DEXAMETHASONE SODIUM PHOSPHATE 10 MG/ML IJ SOLN
INTRAMUSCULAR | Status: DC | PRN
  Administered 2019-08-24: 5 mg via INTRAVENOUS

## 2019-08-24 MED ORDER — ASPIRIN EC 81 MG PO TBEC: 81.0000 mg | DELAYED_RELEASE_TABLET | Freq: Two times a day (BID) | ORAL | 0 refills | Status: AC

## 2019-08-24 MED ORDER — ONDANSETRON HCL 4 MG/2ML IJ SOLN: 4.0000 mg | Freq: Once | INTRAMUSCULAR | Status: DC | PRN

## 2019-08-24 MED ORDER — HYDROMORPHONE HCL 1 MG/ML IJ SOLN: 0.2500 mg | INTRAMUSCULAR | Status: DC | PRN

## 2019-08-24 MED ORDER — ONDANSETRON HCL 4 MG PO TABS: 4.0000 mg | ORAL_TABLET | Freq: Three times a day (TID) | ORAL | 0 refills | Status: AC | PRN

## 2019-08-24 MED ORDER — MIDAZOLAM HCL 2 MG/2ML IJ SOLN
1.0000 mg | INTRAMUSCULAR | Status: DC | PRN
  Administered 2019-08-24: 1 mg via INTRAVENOUS

## 2019-08-24 MED ORDER — ACETAMINOPHEN 500 MG PO TABS
1000.0000 mg | ORAL_TABLET | Freq: Once | ORAL | Status: AC
  Administered 2019-08-24: 1000 mg via ORAL

## 2019-08-24 SURGICAL SUPPLY — 78 items
APL PRP STRL LF DISP 70% ISPRP (MISCELLANEOUS) ×1
BANDAGE ESMARK 6X9 LF (GAUZE/BANDAGES/DRESSINGS) ×1 IMPLANT
BIT DRILL 3.5X122MM AO FIT (BIT) ×2 IMPLANT
BLADE SURG 15 STRL LF DISP TIS (BLADE) ×2 IMPLANT
BLADE SURG 15 STRL SS (BLADE) ×6
BNDG CMPR 9X6 STRL LF SNTH (GAUZE/BANDAGES/DRESSINGS) ×1
BNDG COHESIVE 4X5 TAN STRL (GAUZE/BANDAGES/DRESSINGS) ×3 IMPLANT
BNDG ELASTIC 4X5.8 VLCR STR LF (GAUZE/BANDAGES/DRESSINGS) ×3 IMPLANT
BNDG ELASTIC 6X5.8 VLCR STR LF (GAUZE/BANDAGES/DRESSINGS) ×3 IMPLANT
BNDG ESMARK 6X9 LF (GAUZE/BANDAGES/DRESSINGS) ×3
CHLORAPREP W/TINT 26 (MISCELLANEOUS) ×3 IMPLANT
CLOSURE STERI-STRIP 1/2X4 (GAUZE/BANDAGES/DRESSINGS) ×1
CLSR STERI-STRIP ANTIMIC 1/2X4 (GAUZE/BANDAGES/DRESSINGS) ×2 IMPLANT
COVER BACK TABLE 60X90IN (DRAPES) ×3 IMPLANT
COVER WAND RF STERILE (DRAPES) IMPLANT
CUFF TOURN SGL QUICK 24 (TOURNIQUET CUFF)
CUFF TOURN SGL QUICK 34 (TOURNIQUET CUFF) ×3
CUFF TRNQT CYL 24X4X16.5-23 (TOURNIQUET CUFF) IMPLANT
CUFF TRNQT CYL 34X4.125X (TOURNIQUET CUFF) IMPLANT
DECANTER SPIKE VIAL GLASS SM (MISCELLANEOUS) IMPLANT
DRAPE EXTREMITY T 121X128X90 (DISPOSABLE) ×3 IMPLANT
DRAPE IMP U-DRAPE 54X76 (DRAPES) ×3 IMPLANT
DRAPE OEC MINIVIEW 54X84 (DRAPES) ×3 IMPLANT
DRAPE U-SHAPE 47X51 STRL (DRAPES) ×3 IMPLANT
DRILL 2.6X122MM WL AO SHAFT (BIT) ×2 IMPLANT
DRSG EMULSION OIL 3X3 NADH (GAUZE/BANDAGES/DRESSINGS) ×3 IMPLANT
DRSG PAD ABDOMINAL 8X10 ST (GAUZE/BANDAGES/DRESSINGS) ×7 IMPLANT
ELECT REM PT RETURN 9FT ADLT (ELECTROSURGICAL) ×3
ELECTRODE REM PT RTRN 9FT ADLT (ELECTROSURGICAL) ×1 IMPLANT
GAUZE SPONGE 4X4 12PLY STRL (GAUZE/BANDAGES/DRESSINGS) ×3 IMPLANT
GLOVE BIO SURGEON STRL SZ7.5 (GLOVE) ×6 IMPLANT
GLOVE BIOGEL PI IND STRL 6.5 (GLOVE) IMPLANT
GLOVE BIOGEL PI IND STRL 7.0 (GLOVE) IMPLANT
GLOVE BIOGEL PI IND STRL 8 (GLOVE) ×2 IMPLANT
GLOVE BIOGEL PI INDICATOR 6.5 (GLOVE) ×2
GLOVE BIOGEL PI INDICATOR 7.0 (GLOVE) ×4
GLOVE BIOGEL PI INDICATOR 8 (GLOVE) ×4
GLOVE ECLIPSE 6.5 STRL STRAW (GLOVE) ×2 IMPLANT
GLOVE SURG SS PI 7.0 STRL IVOR (GLOVE) ×4 IMPLANT
GOWN STRL REUS W/ TWL LRG LVL3 (GOWN DISPOSABLE) ×2 IMPLANT
GOWN STRL REUS W/ TWL XL LVL3 (GOWN DISPOSABLE) ×1 IMPLANT
GOWN STRL REUS W/TWL LRG LVL3 (GOWN DISPOSABLE) ×6
GOWN STRL REUS W/TWL XL LVL3 (GOWN DISPOSABLE) ×3
IMPL TIGHTROP W/DRV K-LESS (Anchor) IMPLANT
IMPLANT TIGHTROPE W/DRV K-LESS (Anchor) ×3 IMPLANT
NEEDLE HYPO 22GX1.5 SAFETY (NEEDLE) IMPLANT
NS IRRIG 1000ML POUR BTL (IV SOLUTION) ×3 IMPLANT
PAD CAST 4YDX4 CTTN HI CHSV (CAST SUPPLIES) ×1 IMPLANT
PADDING CAST ABS 4INX4YD NS (CAST SUPPLIES) ×4
PADDING CAST ABS COTTON 4X4 ST (CAST SUPPLIES) ×2 IMPLANT
PADDING CAST COTTON 4X4 STRL (CAST SUPPLIES) ×3
PADDING CAST COTTON 6X4 STRL (CAST SUPPLIES) ×3 IMPLANT
PENCIL SMOKE EVACUATOR (MISCELLANEOUS) ×3 IMPLANT
PLATE 1/3 TUBULAR 7H (Plate) ×2 IMPLANT
SCREW CANCELLOUS 4.0X14 (Screw) ×4 IMPLANT
SCREW CORTEX ST MATTA 3.5X12MM (Screw) ×6 IMPLANT
SCREW CORTEX ST MATTA 3.5X24 (Screw) ×2 IMPLANT
SET BASIN DAY SURGERY F.S. (CUSTOM PROCEDURE TRAY) ×3 IMPLANT
SLEEVE SCD COMPRESS KNEE MED (MISCELLANEOUS) ×2 IMPLANT
SPLINT FAST PLASTER 5X30 (CAST SUPPLIES) ×40
SPLINT PLASTER CAST FAST 5X30 (CAST SUPPLIES) ×20 IMPLANT
SPONGE LAP 4X18 RFD (DISPOSABLE) ×3 IMPLANT
SUCTION FRAZIER HANDLE 10FR (MISCELLANEOUS) ×3
SUCTION TUBE FRAZIER 10FR DISP (MISCELLANEOUS) ×1 IMPLANT
SUT ETHILON 3 0 PS 1 (SUTURE) IMPLANT
SUT MNCRL AB 4-0 PS2 18 (SUTURE) ×2 IMPLANT
SUT MON AB 2-0 CT1 36 (SUTURE) IMPLANT
SUT MON AB 3-0 SH 27 (SUTURE)
SUT MON AB 3-0 SH27 (SUTURE) IMPLANT
SUT VIC AB 0 SH 27 (SUTURE) ×3 IMPLANT
SUT VIC AB 2-0 SH 27 (SUTURE) ×3
SUT VIC AB 2-0 SH 27XBRD (SUTURE) IMPLANT
SYR BULB EAR ULCER 3OZ GRN STR (SYRINGE) ×3 IMPLANT
SYR CONTROL 10ML LL (SYRINGE) IMPLANT
TOWEL GREEN STERILE FF (TOWEL DISPOSABLE) ×6 IMPLANT
TUBE CONNECTING 20'X1/4 (TUBING) ×1
TUBE CONNECTING 20X1/4 (TUBING) ×2 IMPLANT
UNDERPAD 30X36 HEAVY ABSORB (UNDERPADS AND DIAPERS) ×3 IMPLANT

## 2019-08-24 NOTE — Anesthesia Postprocedure Evaluation (Signed)
Anesthesia Post Note  Patient: Debra Walker  Procedure(s) Performed: OPEN REDUCTION INTERNAL FIXATION (ORIF) ANKLE FRACTURE WITH SYNDESMOSIS (Left Ankle)     Patient location during evaluation: PACU Anesthesia Type: Regional and General Level of consciousness: awake and alert Pain management: pain level controlled Vital Signs Assessment: post-procedure vital signs reviewed and stable Respiratory status: spontaneous breathing, nonlabored ventilation, respiratory function stable and patient connected to nasal cannula oxygen Cardiovascular status: blood pressure returned to baseline and stable Postop Assessment: no apparent nausea or vomiting Anesthetic complications: no    Last Vitals:  Vitals:   08/24/19 1515 08/24/19 1530  BP: 112/61 (!) 115/57  Pulse: 63 60  Resp: 11 14  Temp:    SpO2: 100% 100%    Last Pain:  Vitals:   08/24/19 1530  TempSrc:   PainSc: 0-No pain                 Trevor Iha

## 2019-08-24 NOTE — Anesthesia Procedure Notes (Signed)
Anesthesia Regional Block: Adductor canal block   Pre-Anesthetic Checklist: ,, timeout performed, Correct Patient, Correct Site, Correct Laterality, Correct Procedure, Correct Position, site marked, Risks and benefits discussed,  Surgical consent,  Pre-op evaluation,  At surgeon's request and post-op pain management  Laterality: Lower and Left  Prep: chloraprep       Needles:  Injection technique: Single-shot  Needle Type: Echogenic Needle     Needle Length: 9cm  Needle Gauge: 22     Additional Needles:   Procedures:,,,, ultrasound used (permanent image in chart),,,,  Narrative:  Start time: 08/24/2019 12:53 PM End time: 08/24/2019 1:00 PM Injection made incrementally with aspirations every 5 mL.  Performed by: Personally  Anesthesiologist: Trevor Iha, MD  Additional Notes: Block assessed prior to surgery. Pt tolerated procedure well.

## 2019-08-24 NOTE — Anesthesia Procedure Notes (Addendum)
Anesthesia Regional Block: Popliteal block   Pre-Anesthetic Checklist: ,, timeout performed, Correct Patient, Correct Site, Correct Laterality, Correct Procedure, Correct Position, site marked, Risks and benefits discussed, pre-op evaluation,  At surgeon's request and post-op pain management  Laterality: Left  Prep: Maximum Sterile Barrier Precautions used, chloraprep       Needles:  Injection technique: Single-shot  Needle Type: Echogenic Needle     Needle Length: 9cm  Needle Gauge: 21     Additional Needles:   Procedures:,,,, ultrasound used (permanent image in chart),,,,  Narrative:  Start time: 08/24/2019 12:45 PM End time: 08/24/2019 12:52 PM Injection made incrementally with aspirations every 5 mL.  Performed by: Personally  Anesthesiologist: Trevor Iha, MD  Additional Notes: Block assessed. Patient tolerated procedure well.

## 2019-08-24 NOTE — Discharge Instructions (Signed)
It is very important for you to Elevate your leg - Toes above nose as much as possible to reduce pain / swelling. If needed, you may increase pain medication for the first few days post op to 2 tablets every 4 hours.  Weight Bearing:  Non weight bearing affected leg.  Diet: As you were doing prior to hospitalization   Shower:  You have a splint on, leave the splint in place and keep the splint dry with a plastic bag.  Dressing:  You have a splint. Leave the splint in place and we will change your bandages during your first follow-up appointment.  You may loosen and re-apply ace wrap if it feels too tight.    Activity:  Increase activity slowly as tolerated, but follow the weight bearing instructions below.  The rules on driving is that you can not be taking narcotics while you drive, and you must feel in control of the vehicle.    To prevent constipation:  Narcotic medicines cause constipation.  Wean these as soon as is appropriate.   You may use a stool softener such as -  Colace (over the counter) 100 mg by mouth twice a day  Drink plenty of fluids (prune juice may be helpful) and high fiber foods Miralax (over the counter) for constipation as needed.    Itching:  If you experience itching with your medications, try taking only a single pain pill, or even half a pain pill at a time.  You can also use benadryl over the counter for itching or also to help with sleep.   Precautions:  If you experience chest pain or shortness of breath - call 911 immediately for transfer to the hospital emergency department!!  If you develop a fever greater that 101 F, purulent drainage from wound, increased redness or drainage from wound, or calf pain -- Call the office at [phone]                                                 Follow- Up Appointment:  Please call for an appointment to be seen in 1-2 weeks [place] - [phone]   No Tylenol before 6:30pm   Post Anesthesia Home Care  Instructions  Activity: Get plenty of rest for the remainder of the day. A responsible individual must stay with you for 24 hours following the procedure.  For the next 24 hours, DO NOT: -Drive a car -Paediatric nurse -Drink alcoholic beverages -Take any medication unless instructed by your physician -Make any legal decisions or sign important papers.  Meals: Start with liquid foods such as gelatin or soup. Progress to regular foods as tolerated. Avoid greasy, spicy, heavy foods. If nausea and/or vomiting occur, drink only clear liquids until the nausea and/or vomiting subsides. Call your physician if vomiting continues.  Special Instructions/Symptoms: Your throat may feel dry or sore from the anesthesia or the breathing tube placed in your throat during surgery. If this causes discomfort, gargle with warm salt water. The discomfort should disappear within 24 hours.  If you had a scopolamine patch placed behind your ear for the management of post- operative nausea and/or vomiting:  1. The medication in the patch is effective for 72 hours, after which it should be removed.  Wrap patch in a tissue and discard in the trash. Wash hands thoroughly with soap and water.  2. You may remove the patch earlier than 72 hours if you experience unpleasant side effects which may include dry mouth, dizziness or visual disturbances. 3. Avoid touching the patch. Wash your hands with soap and water after contact with the patch.    Regional Anesthesia Blocks  1. Numbness or the inability to move the "blocked" extremity may last from 3-48 hours after placement. The length of time depends on the medication injected and your individual response to the medication. If the numbness is not going away after 48 hours, call your surgeon.  2. The extremity that is blocked will need to be protected until the numbness is gone and the  Strength has returned. Because you cannot feel it, you will need to take extra care to  avoid injury. Because it may be weak, you may have difficulty moving it or using it. You may not know what position it is in without looking at it while the block is in effect.  3. For blocks in the legs and feet, returning to weight bearing and walking needs to be done carefully. You will need to wait until the numbness is entirely gone and the strength has returned. You should be able to move your leg and foot normally before you try and bear weight or walk. You will need someone to be with you when you first try to ensure you do not fall and possibly risk injury.  4. Bruising and tenderness at the needle site are common side effects and will resolve in a few days.  5. Persistent numbness or new problems with movement should be communicated to the surgeon or the [facility] [phone] [facility] [phone]).

## 2019-08-24 NOTE — Op Note (Signed)
08/24/2019  2:58 PM  PATIENT:  Debra Walker    PRE-OPERATIVE DIAGNOSIS:  LEFT ANKLE FRACTURE  POST-OPERATIVE DIAGNOSIS:  Same  PROCEDURE:  OPEN REDUCTION INTERNAL FIXATION (ORIF) ANKLE FRACTURE  SURGEON:  Sheral Apley, MD  ASSISTANT: Aquilla Hacker, PA-C, he was present and scrubbed throughout the case, critical for completion in a timely fashion, and for retraction, instrumentation, and closure.   ANESTHESIA:   gen   PREOPERATIVE INDICATIONS:  [name] is a  54 y.o. female with a diagnosis of LEFT ANKLE FRACTURE who failed conservative measures and elected for surgical management.    The risks benefits and alternatives were discussed with the patient preoperatively including but not limited to the risks of infection, bleeding, nerve injury, cardiopulmonary complications, the need for revision surgery, among others, and the patient was willing to proceed.  OPERATIVE IMPLANTS: stryker plate and tight rope  OPERATIVE FINDINGS: Unstable ankle fracture. Stable syndesmosis post op  BLOOD LOSS: min  COMPLICATIONS: none  TOURNIQUET TIME:  OPERATIVE PROCEDURE:  Patient was identified in the preoperative holding area and site was marked by me He was transported to the operating theater and placed on the table in supine position taking care to pad all bony prominences. After a preincinduction time out anesthesia was induced. The left lower extremity was prepped and draped in normal sterile fashion and a pre-incision timeout was performed. Debra Rile Bellotti received ancef for preoperative antibiotics.   I made a lateral incision of roughly 7 cm dissection was carried down sharply to the distal fibula and then spreading dissection was used proximally to protect the superficial peroneal nerve. I sharply incised the periosteum and took care to protect the peroneal tendons. I then debrided the fracture site and performed a reduction maneuver which was held in place with a  clamp.   I placed a lag screw across the fracture  I then selected a 7-hole one third tubular plate and placed in a neutralization fashion care was taken distally so as not to penetrate the joint with the cancellus screws.  I then stressed the syndesmosis and it was unstable for syndesmotic fixation I performed a reduction maneuver with a clamp and placed a tightrope  The wound was then thoroughly irrigated and closed using a 0 Vicryl and absorbable Monocryl sutures. He was placed in a short leg splint.   POST OPERATIVE PLAN: Non-weightbearing. DVT prophylaxis will consist of mobilization and chemical px

## 2019-08-24 NOTE — Interval H&P Note (Signed)
I participated in the care of this patient and agree with the above history, physical and evaluation. I performed a review of the history and a physical exam as detailed   Shawnique Mariotti Daniel Tessla Spurling MD  

## 2019-08-24 NOTE — Progress Notes (Signed)
AssistedDr. Houser with left, ultrasound guided, popliteal, adductor canal block. Side rails up, monitors on throughout procedure. See vital signs in flow sheet. Tolerated Procedure well.  

## 2019-08-24 NOTE — Transfer of Care (Signed)
Immediate Anesthesia Transfer of Care Note  Patient: Debra Walker  Procedure(s) Performed: OPEN REDUCTION INTERNAL FIXATION (ORIF) ANKLE FRACTURE (Left Ankle)  Patient Location: PACU  Anesthesia Type:GA combined with regional for post-op pain  Level of Consciousness: awake, alert , oriented and patient cooperative  Airway & Oxygen Therapy: Patient Spontanous Breathing and Patient connected to nasal cannula oxygen  Post-op Assessment: Report given to RN and Post -op Vital signs reviewed and stable  Post vital signs: Reviewed and stable  Last Vitals:  Vitals Value Taken Time  BP 106/74 08/24/19 1508  Temp    Pulse 74 08/24/19 1509  Resp 13 08/24/19 1509  SpO2 100 % 08/24/19 1509  Vitals shown include unvalidated device data.  Last Pain:  Vitals:   08/24/19 1211  TempSrc:   PainSc: 4       Patients Stated Pain Goal: 4 (08/24/19 1211)  Complications: No apparent anesthesia complications

## 2019-08-24 NOTE — Anesthesia Procedure Notes (Signed)
Procedure Name: LMA Insertion Date/Time: 08/24/2019 2:06 PM Performed by: Yolonda Kida, CRNA Pre-anesthesia Checklist: Patient identified, Emergency Drugs available, Suction available and Patient being monitored Patient Re-evaluated:Patient Re-evaluated prior to induction Oxygen Delivery Method: Circle system utilized Preoxygenation: Pre-oxygenation with 100% oxygen Induction Type: IV induction LMA: LMA inserted LMA Size: 4.0 Number of attempts: 1 Placement Confirmation: positive ETCO2 and breath sounds checked- equal and bilateral Tube secured with: Tape Dental Injury: Teeth and Oropharynx as per pre-operative assessment

## 2019-08-28 ENCOUNTER — Encounter: Payer: Self-pay | Admitting: *Deleted

## 2020-09-02 ENCOUNTER — Other Ambulatory Visit: Payer: Self-pay | Admitting: Nurse Practitioner

## 2020-09-02 DIAGNOSIS — E041 Nontoxic single thyroid nodule: Secondary | ICD-10-CM

## 2020-09-23 ENCOUNTER — Other Ambulatory Visit: Payer: Self-pay | Admitting: Nurse Practitioner

## 2020-09-23 DIAGNOSIS — E041 Nontoxic single thyroid nodule: Secondary | ICD-10-CM

## 2020-10-21 ENCOUNTER — Ambulatory Visit (INDEPENDENT_AMBULATORY_CARE_PROVIDER_SITE_OTHER): Payer: No Typology Code available for payment source

## 2020-10-21 ENCOUNTER — Other Ambulatory Visit: Payer: Self-pay

## 2020-10-21 DIAGNOSIS — E041 Nontoxic single thyroid nodule: Secondary | ICD-10-CM | POA: Diagnosis not present

## 2020-10-22 IMAGING — DX DG ANKLE COMPLETE 3+V*L*
3 series · 3 of 3 positions shown · non-contrast
Comparison: 08/19/2019

CLINICAL DATA: Left ankle fracture status post reduction

EXAM:
LEFT ANKLE COMPLETE - 3+ VIEW

[ankle ap]
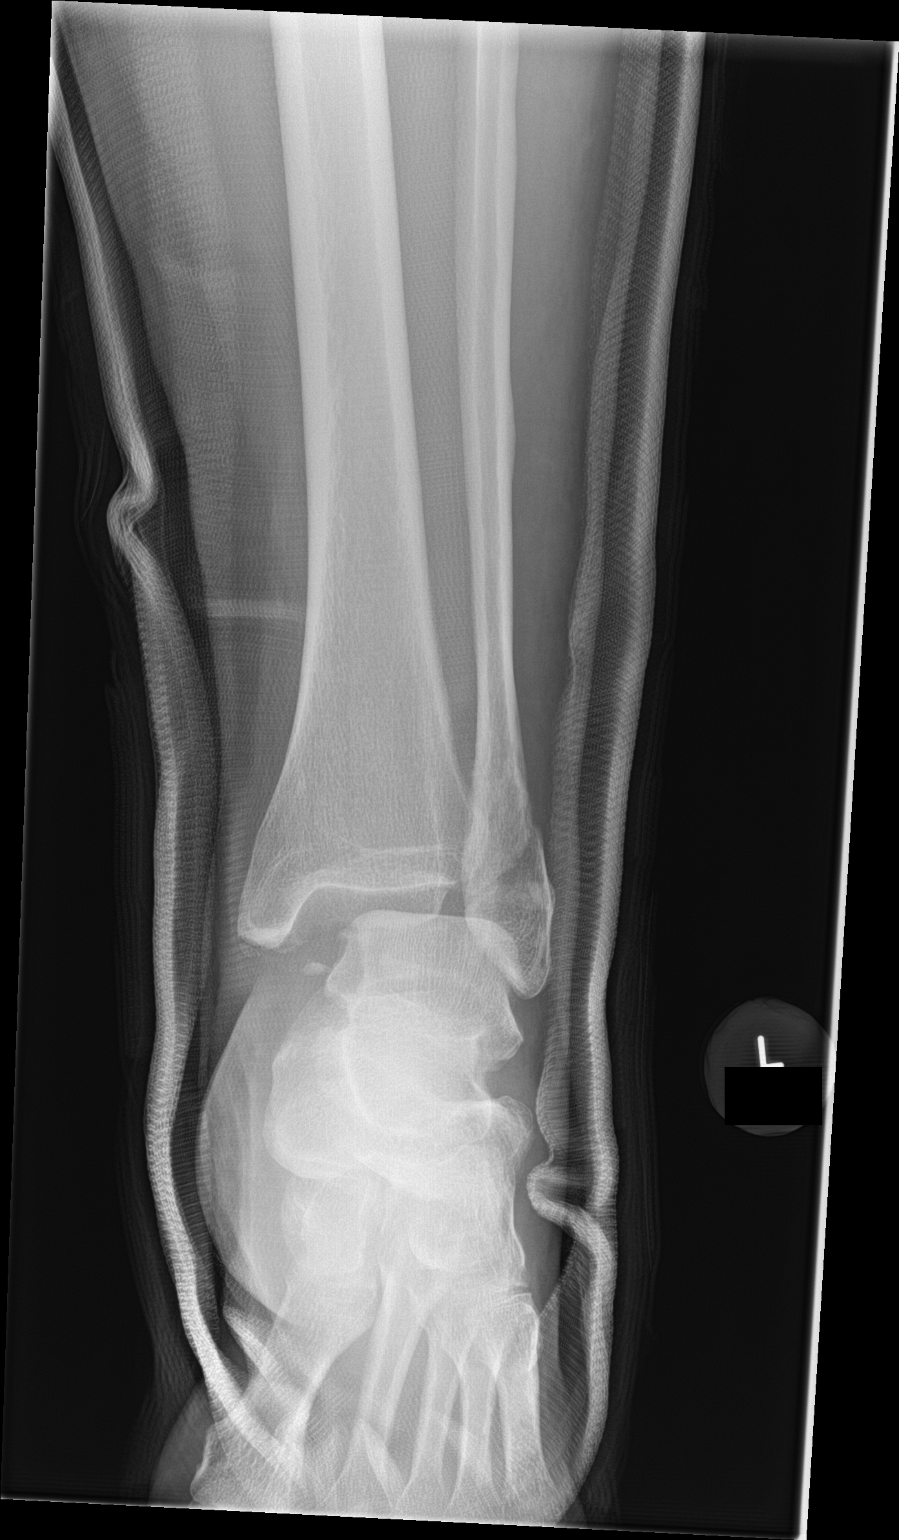

[ankle obl]
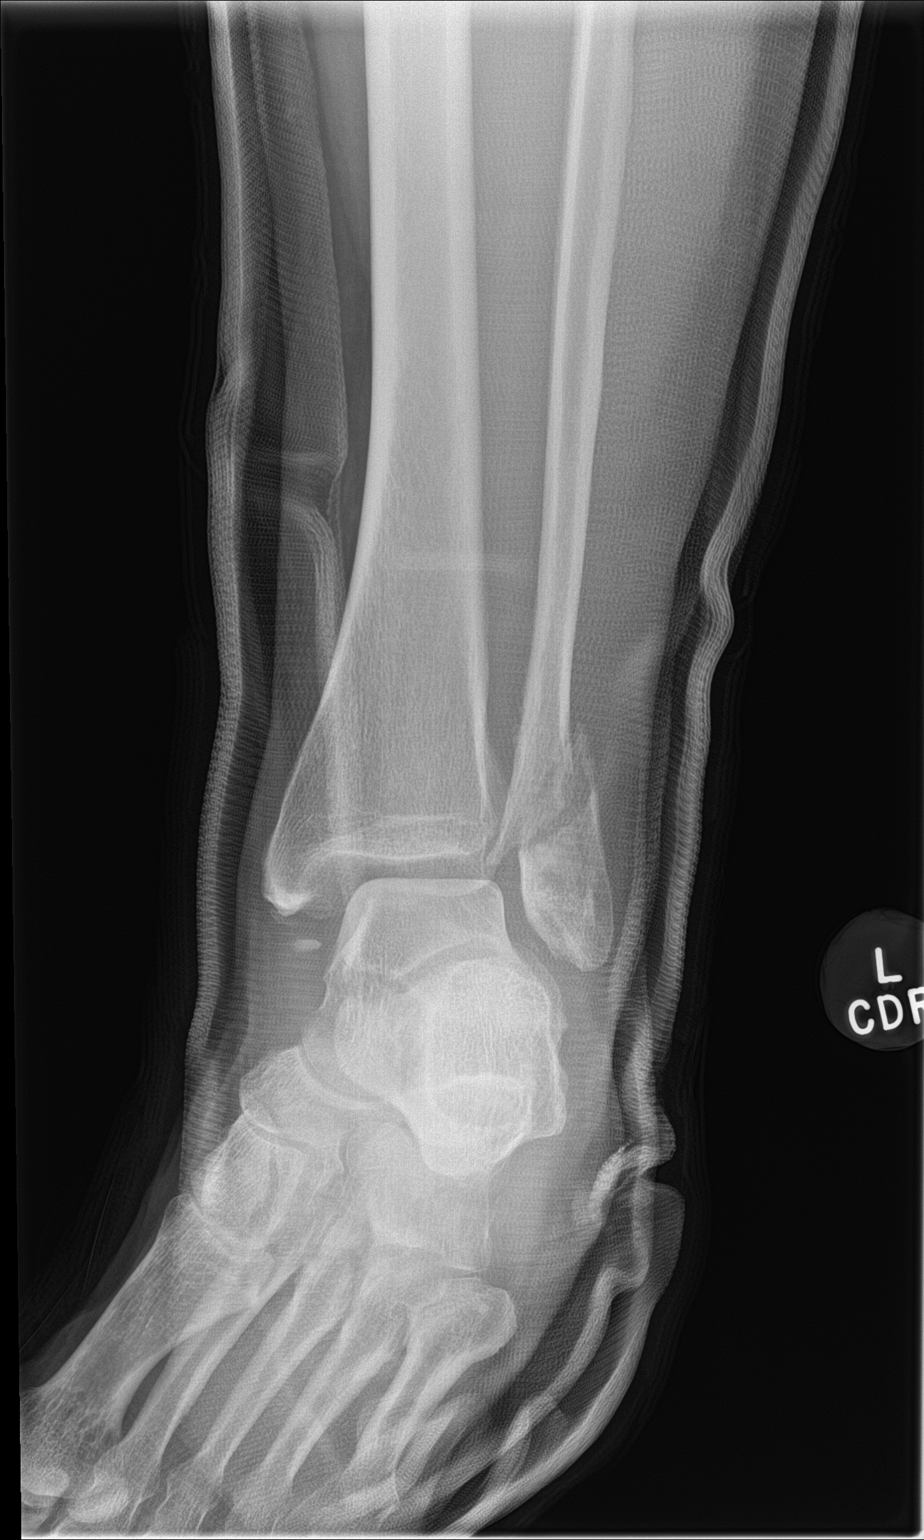

[ankle lat]
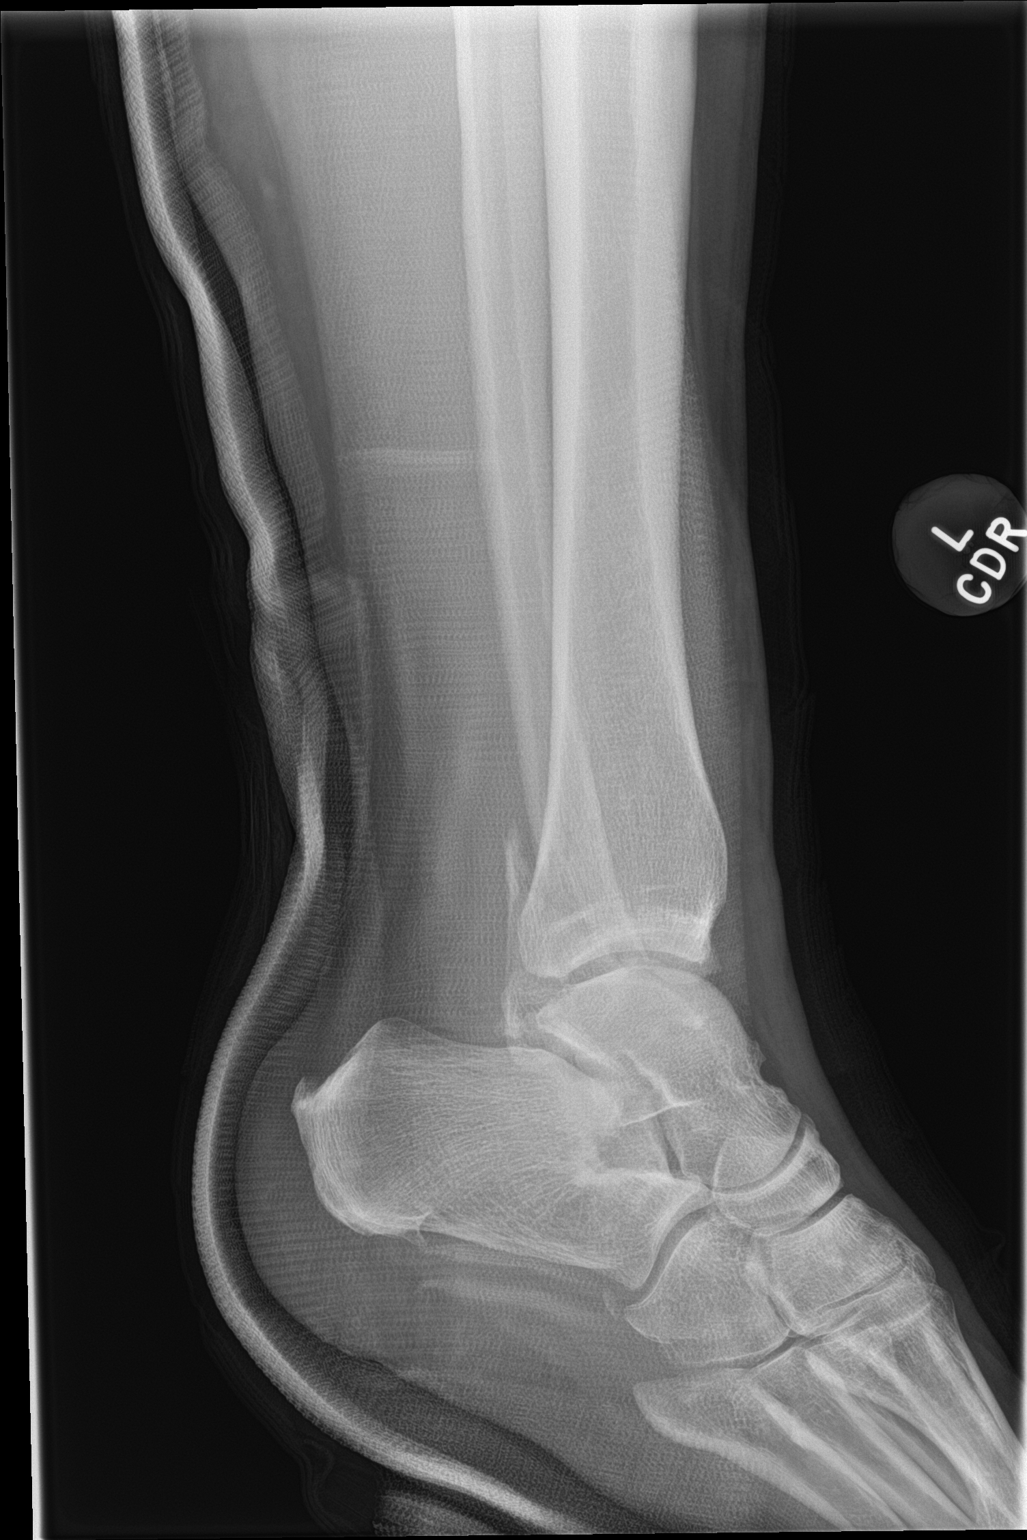

[3 of 3 positions shown; findings below may reference images not displayed]

FINDINGS: Frontal, oblique, lateral views of the left ankle are obtained.
Casting material obscures underlying bony detail. There is
persistent lateral translation of the talus relative to the tibial
plafond and. Displaced lateral malleolar fracture demonstrates
improved alignment with decreased angulation and translation. Small
ossific density medial to the left talus is unchanged in position,
possibly related to acute avulsion fracture.
IMPRESSION: 1. Persistent lateral translation of the talus relative to the
tibial plafond and.
2. Decreased angulation and displacement of the lateral malleolar
fracture fragment.
3. Ossific density medial to the talus, possibly representing a
displaced avulsion fracture.

## 2021-12-25 IMAGING — US US THYROID
1 series · 14 of 25 positions shown · non-contrast
Comparison: 08/07/2019

CLINICAL DATA: Nodule

EXAM:
THYROID ULTRASOUND
TECHNIQUE: Ultrasound examination of the thyroid gland and adjacent soft
tissues was performed.

[Series 1: us thyroid · 14 of 32 slices shown]
[im 1/32]
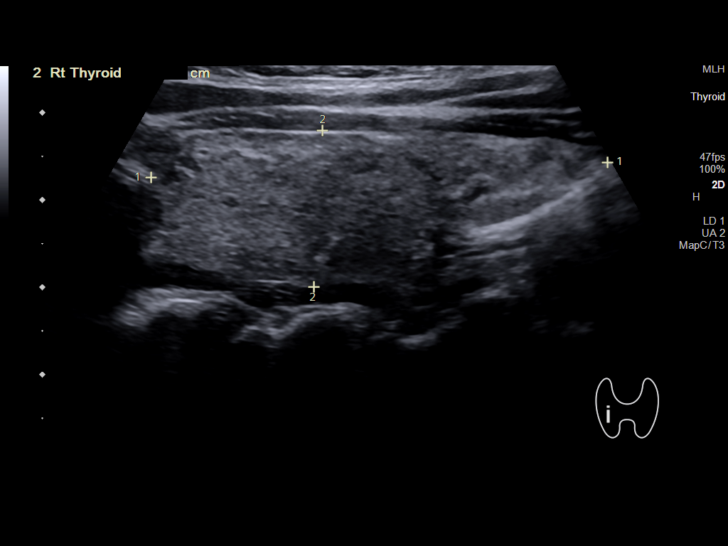
[im 3/32]
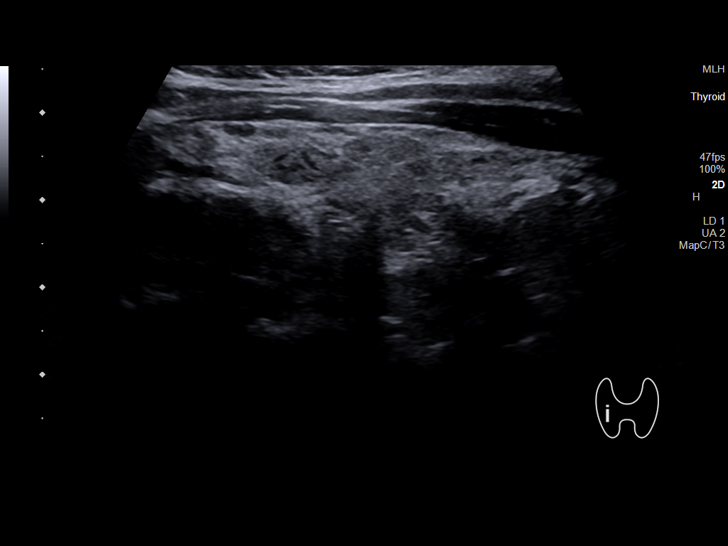
[im 6/32]
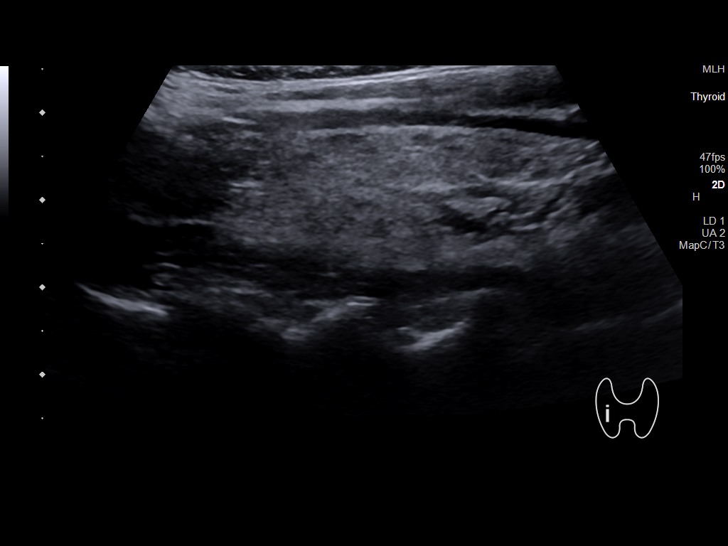
[im 8/32]
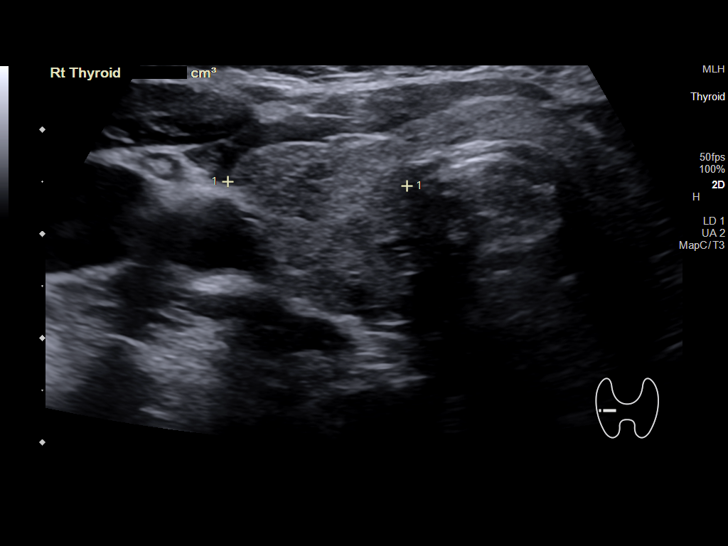
[im 11/32]
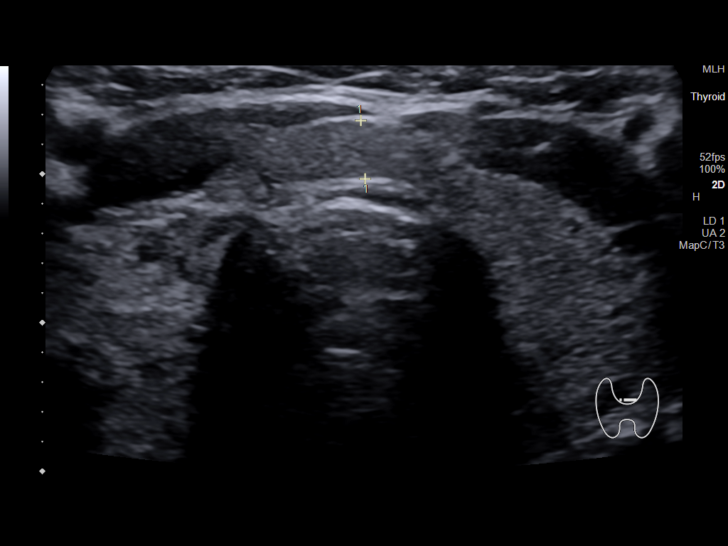
[im 12/32]
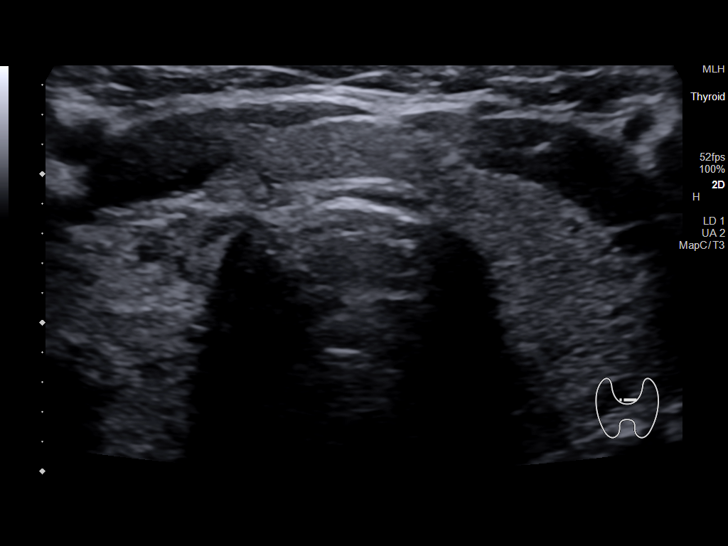
[im 15/32]
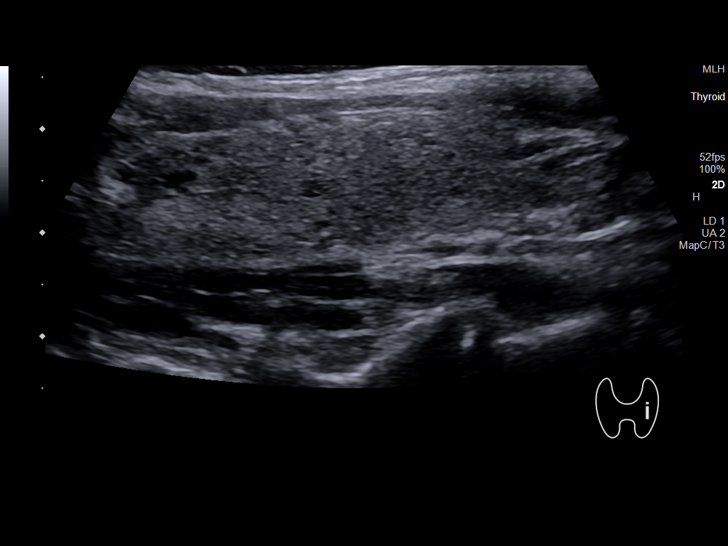
[im 17/32]
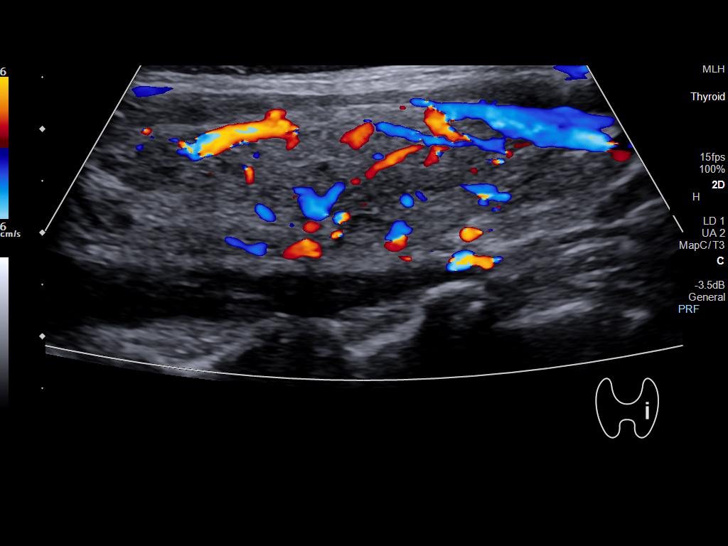
[im 20/32]
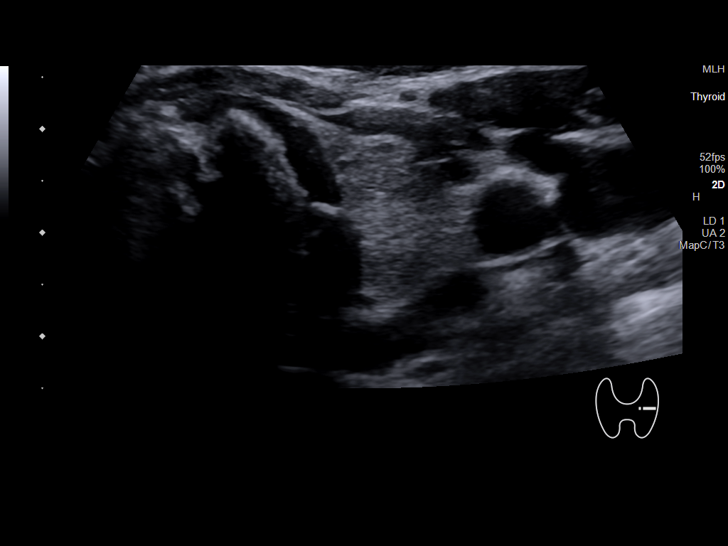
[im 21/32]
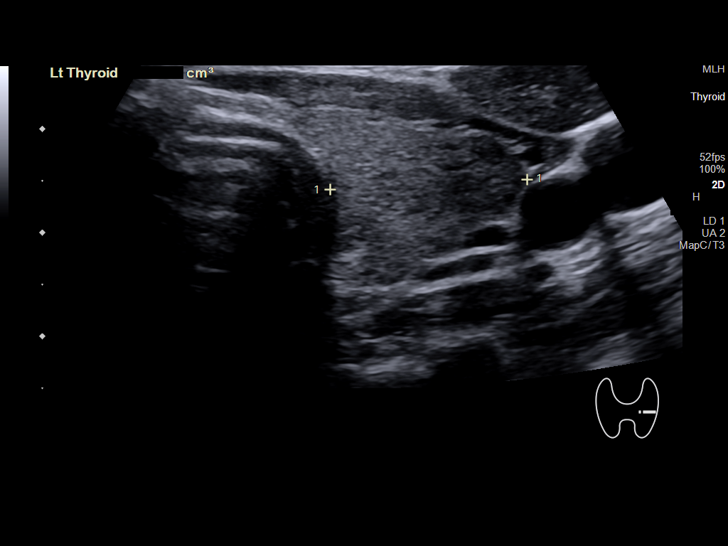
[im 24/32]
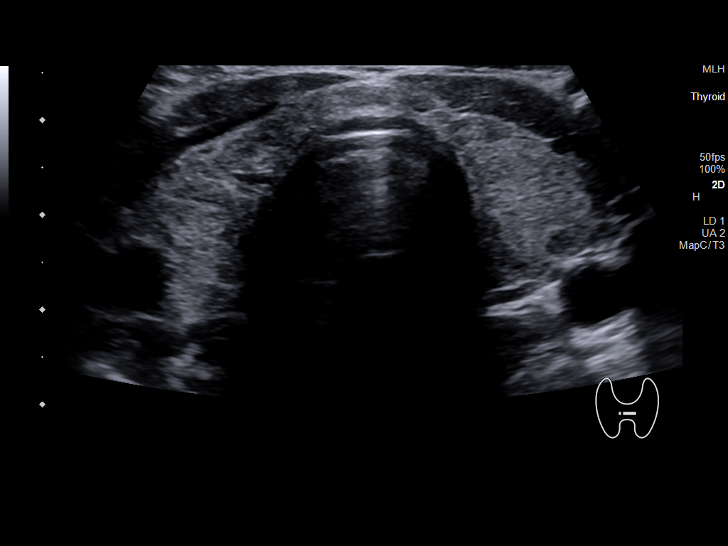
[im 26/32]
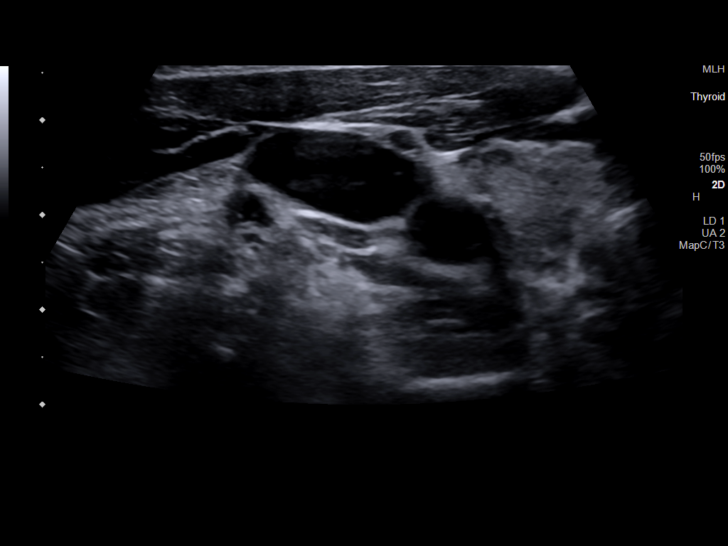
[im 29/32]
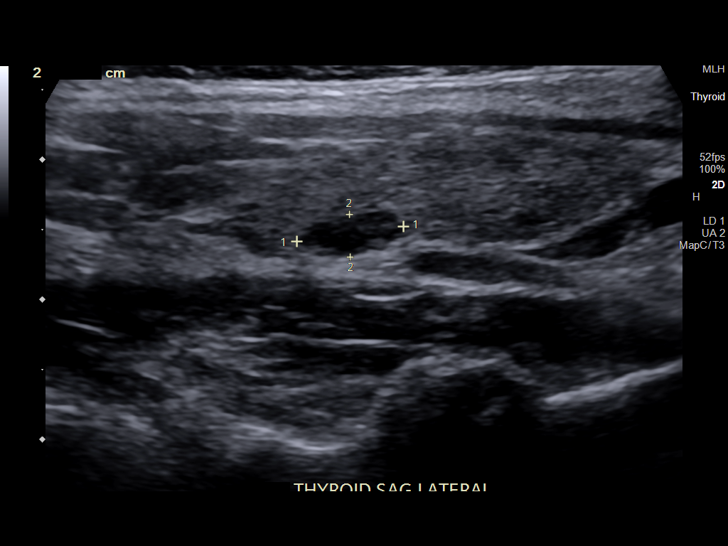
[im 32/32]
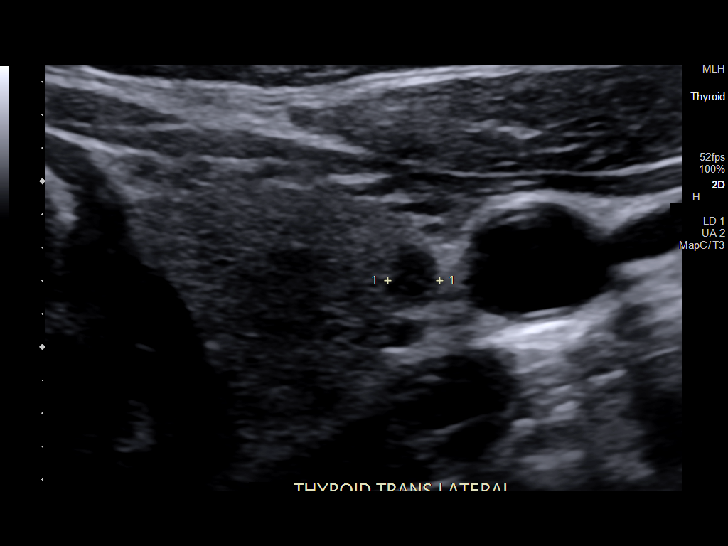

[14 of 25 positions shown; findings below may reference images not displayed]

FINDINGS: Parenchymal Echotexture: Mildly heterogeneous

Isthmus: 0.4 cm

Right lobe: 5.2 x 1.8 x 1.7 cm

Left lobe: 5.2 x 1.4 x 1.9 cm

_________________________________________________________

Estimated total number of nodules >/= 1 cm: 0

Number of spongiform nodules >/=  2 cm not described below (TR1): 0

Number of mixed cystic and solid nodules >/= 1.5 cm not described
below (TR2): 0

_________________________________________________________

0.8 x 0.3 x 0.3 cm hypoechoic solid left thyroid nodule is not
significantly changed since prior examination.

It does not meet criteria for FNA or imaging surveillance.
IMPRESSION: Subcentimeter left thyroid nodule is not significantly changed from
prior examination, and does not meet criteria for FNA or imaging
surveillance.

The above is in keeping with the ACR TI-RADS recommendations - [HOSPITAL] 3817;[DATE].
